# Patient Record
Sex: Female | Born: 1950 | Race: Black or African American | Hispanic: No | Smoking: Current every day smoker
Health system: Southern US, Community
[De-identification: ages and names within clinical notes are randomized; demographics above are authoritative.]

## PROBLEM LIST (undated history)

## (undated) DIAGNOSIS — F0390 Unspecified dementia without behavioral disturbance: Secondary | ICD-10-CM

## (undated) DIAGNOSIS — I10 Essential (primary) hypertension: Secondary | ICD-10-CM

## (undated) DIAGNOSIS — G459 Transient cerebral ischemic attack, unspecified: Secondary | ICD-10-CM

## (undated) HISTORY — PX: ABDOMINAL HYSTERECTOMY: SHX81

---

## 2016-06-30 ENCOUNTER — Other Ambulatory Visit: Payer: Self-pay | Admitting: Family Medicine

## 2016-06-30 DIAGNOSIS — Z1231 Encounter for screening mammogram for malignant neoplasm of breast: Secondary | ICD-10-CM

## 2016-07-06 ENCOUNTER — Ambulatory Visit
Admission: RE | Admit: 2016-07-06 | Discharge: 2016-07-06 | Disposition: A | Discharge status: Home / Self Care | Payer: Medicare Other | Source: Ambulatory Visit | Attending: Family Medicine | Admitting: Family Medicine

## 2016-07-06 ENCOUNTER — Encounter: Payer: Self-pay | Admitting: Radiology

## 2016-07-06 DIAGNOSIS — Z1231 Encounter for screening mammogram for malignant neoplasm of breast: Secondary | ICD-10-CM | POA: Diagnosis not present

## 2017-07-05 ENCOUNTER — Other Ambulatory Visit: Payer: Self-pay | Admitting: Family Medicine

## 2017-07-05 DIAGNOSIS — Z1231 Encounter for screening mammogram for malignant neoplasm of breast: Secondary | ICD-10-CM

## 2017-08-03 ENCOUNTER — Ambulatory Visit
Admission: RE | Admit: 2017-08-03 | Discharge: 2017-08-03 | Disposition: A | Discharge status: Home / Self Care | Payer: Medicare Other | Source: Ambulatory Visit | Attending: Family Medicine | Admitting: Family Medicine

## 2017-08-03 DIAGNOSIS — Z1231 Encounter for screening mammogram for malignant neoplasm of breast: Secondary | ICD-10-CM | POA: Diagnosis not present

## 2017-08-03 DIAGNOSIS — R928 Other abnormal and inconclusive findings on diagnostic imaging of breast: Secondary | ICD-10-CM | POA: Diagnosis not present

## 2017-08-09 ENCOUNTER — Other Ambulatory Visit: Payer: Self-pay | Admitting: Family Medicine

## 2017-08-09 DIAGNOSIS — R928 Other abnormal and inconclusive findings on diagnostic imaging of breast: Secondary | ICD-10-CM

## 2017-08-12 ENCOUNTER — Ambulatory Visit
Admission: RE | Admit: 2017-08-12 | Discharge: 2017-08-12 | Disposition: A | Discharge status: Home / Self Care | Payer: Medicare Other | Source: Ambulatory Visit | Attending: Family Medicine | Admitting: Family Medicine

## 2017-08-12 DIAGNOSIS — R928 Other abnormal and inconclusive findings on diagnostic imaging of breast: Secondary | ICD-10-CM

## 2017-11-28 ENCOUNTER — Emergency Department: Payer: Medicare Other

## 2017-11-28 ENCOUNTER — Encounter: Payer: Self-pay | Admitting: Emergency Medicine

## 2017-11-28 ENCOUNTER — Emergency Department
Admission: EM | Admit: 2017-11-28 | Discharge: 2017-11-28 | Disposition: A | Discharge status: Home / Self Care | Payer: Medicare Other | Attending: Emergency Medicine | Admitting: Emergency Medicine

## 2017-11-28 ENCOUNTER — Other Ambulatory Visit: Payer: Self-pay

## 2017-11-28 DIAGNOSIS — N390 Urinary tract infection, site not specified: Secondary | ICD-10-CM | POA: Insufficient documentation

## 2017-11-28 DIAGNOSIS — R52 Pain, unspecified: Secondary | ICD-10-CM

## 2017-11-28 DIAGNOSIS — F17210 Nicotine dependence, cigarettes, uncomplicated: Secondary | ICD-10-CM | POA: Insufficient documentation

## 2017-11-28 DIAGNOSIS — M79604 Pain in right leg: Secondary | ICD-10-CM | POA: Insufficient documentation

## 2017-11-28 HISTORY — DX: Essential (primary) hypertension: I10

## 2017-11-28 LAB — URINALYSIS, COMPLETE (UACMP) WITH MICROSCOPIC
Bacteria, UA: NONE SEEN
Bilirubin Urine: NEGATIVE
Glucose, UA: NEGATIVE mg/dL
HGB URINE DIPSTICK: NEGATIVE
KETONES UR: NEGATIVE mg/dL
NITRITE: NEGATIVE
PH: 7 (ref 5.0–8.0)
Protein, ur: NEGATIVE mg/dL
Specific Gravity, Urine: 1.011 (ref 1.005–1.030)

## 2017-11-28 MED ORDER — MELOXICAM 15 MG PO TABS: 15.0000 mg | ORAL_TABLET | Freq: Every day | ORAL | 2 refills | Status: DC

## 2017-11-28 MED ORDER — CIPROFLOXACIN HCL 250 MG PO TABS: 250.0000 mg | ORAL_TABLET | Freq: Two times a day (BID) | ORAL | 0 refills | Status: DC

## 2017-11-28 NOTE — ED Triage Notes (Signed)
Pt in via POV with complaints of pain to right knee since Friday, denies any recent injury.  Bruising and old excoriation noted to right knee.  NAD noted at this time.

## 2017-11-28 NOTE — Discharge Instructions (Signed)
Follow-up with your regular doctor if you are not better in 5 days.  Use medication as prescribed.  You had a trace of white cells in your urine so we will treat you for a urinary tract infection.  We are also running a urine culture to make sure we have you on the right antibiotic.  Use the meloxicam for pain as needed.  Apply ice to the knee.  Return if you are worsening

## 2017-11-28 NOTE — ED Provider Notes (Signed)
[facility] Emergency Department Provider Note  ____________________________________________   First MD Initiated Contact with Patient 11/28/17 1231     (approximate)  I have reviewed the triage vital signs and the nursing notes.   HISTORY  Chief Complaint Leg Pain    HPI Lydia Avila is a 67 y.o. female who presents to the emergency department with her daughter.  She can planes of knee pain since Friday.  There is some bruising or questionable burn to the right side of the knee.  The daughter is concerned that she might have a blood clot.  Patient denies chest pain or shortness of breath.  She denies any known injury to the leg.  The daughter is also concerned because she seems to be a little more confused than normal.  Past Medical History:  Diagnosis Date  . Hypertension     There are no active problems to display for this patient.   Past Surgical History:  Procedure Laterality Date  . ABDOMINAL HYSTERECTOMY      Prior to Admission medications   Medication Sig Start Date End Date Taking? Authorizing Provider  ciprofloxacin (CIPRO) 250 MG tablet Take 1 tablet (250 mg total) by mouth 2 (two) times daily. 11/28/17   Zaydon Kinser, Roselyn Bering, PA-C  meloxicam (MOBIC) 15 MG tablet Take 1 tablet (15 mg total) by mouth daily. 11/28/17 11/28/18  Faythe Ghee, PA-C    Allergies Patient has no known allergies.  Family History  Problem Relation Age of Onset  . Breast cancer Neg Hx     Social History Social History   Tobacco Use  . Smoking status: Current Every Day Smoker    Packs/day: 0.25    Types: Cigarettes  . Smokeless tobacco: Never Used  Substance Use Topics  . Alcohol use: No    Frequency: Never  . Drug use: No    Review of Systems  Constitutional: No fever/chills Eyes: No visual changes. ENT: No sore throat. Respiratory: Denies cough Genitourinary: Negative for dysuria. Musculoskeletal: Negative for back pain.  Positive for right knee  pain Skin:  Questionable burn versus bruise    ____________________________________________   PHYSICAL EXAM:  VITAL SIGNS: ED Triage Vitals  Enc Vitals Group     BP 11/28/17 1130 (!) 138/45     Pulse Rate 11/28/17 1130 65     Resp 11/28/17 1130 18     Temp 11/28/17 1130 99.1 F (37.3 C)     Temp Source 11/28/17 1130 Oral     SpO2 11/28/17 1130 95 %     Weight 11/28/17 1131 170 lb (77.1 kg)     Height 11/28/17 1131 5\' 1"  (1.549 m)     Head Circumference --      Peak Flow --      Pain Score 11/28/17 1138 6     Pain Loc --      Pain Edu? --      Excl. in GC? --     Constitutional: Alert and oriented. Well appearing and in no acute distress. Eyes: Conjunctivae are normal.  Head: Atraumatic. Nose: No congestion/rhinnorhea. Mouth/Throat: Mucous membranes are moist.   Cardiovascular: Normal rate, regular rhythm.  Heart sounds are normal Respiratory: Normal respiratory effort.  No retractions, lungs clear to auscultation GU: deferred Musculoskeletal: FROM all extremities, warm and well perfused, right knee is tender.  There is minimal if any swelling.  Neurovascular is intact Neurologic:  Normal speech and language.  Skin:  Skin is warm, dry and intact.  On the right knee there is a red odd patterns rash, questionable burn from the space heater, no blisters are noted.  The area is in a straight line and then in a fishnet-like pattern.  There is no drainage from the area Psychiatric: Mood and affect are normal. Speech and behavior are normal.  ____________________________________________   LABS (all labs ordered are listed, but only abnormal results are displayed)  Labs Reviewed  URINALYSIS, COMPLETE (UACMP) WITH MICROSCOPIC - Abnormal; Notable for the following components:      Result Value   Color, Urine YELLOW (*)    APPearance CLEAR (*)    Leukocytes, UA TRACE (*)    Squamous Epithelial / LPF 0-5 (*)    All other components within normal limits  URINE CULTURE    ____________________________________________   ____________________________________________  RADIOLOGY  X-ray of the right knee is negative for fracture  Ultrasound of the right leg is negative for DVT  ____________________________________________   PROCEDURES  Procedure(s) performed: No  Procedures    ____________________________________________   INITIAL IMPRESSION / ASSESSMENT AND PLAN / ED COURSE  Pertinent labs & imaging results that were available during my care of the patient were reviewed by me and considered in my medical decision making (see chart for details).  Patient is a 67 year old female complaining of right knee pain.  Her daughters IS also concerned about her confusion.  She states she is been more confused lately  On physical exam the right knee is tender and there is an odd rash on the right side of the knee questionable burn from a space heater.  With a small abrasion.  The calf is minimally tender  X-ray of the right knee is negative.  Ultrasound of the right knee is negative for DVT.  Urinalysis is positive for trace of leuks.  Test results were discussed with the patient and her daughter.  Prescription for Cipro 250 mg twice daily for 7 days and meloxicam 15 mg daily were provided for the patient.  The daughter was instructed to monitor the rash.  If it is worsening they should return to the emergency department or see her regular doctor.  Patient is not better in 5-7 days they should follow-up with her regular doctor.  Urine culture was ordered.  Instructed patient that they would call her if the urine culture showed bacteria that was resistant to the antibiotic.  Patient states she understands.  They will comply with the recommendations.  Patient was discharged in stable condition     As part of my medical decision making, I reviewed the following data within the electronic MEDICAL RECORD NUMBER History obtained from family, Nursing notes reviewed and  incorporated, Labs reviewed UA positive trace leuks, Radiograph reviewed x-ray of the knee is negative, ultrasound of the right leg is negative for DVT, Notes from prior ED visits and [place] Controlled Substance Database  ____________________________________________   FINAL CLINICAL IMPRESSION(S) / ED DIAGNOSES  Final diagnoses:  Right leg pain  Urinary tract infection without hematuria, site unspecified      NEW MEDICATIONS STARTED DURING THIS VISIT:  Discharge Medication List as of 11/28/2017  2:11 PM    START taking these medications   Details  ciprofloxacin (CIPRO) 250 MG tablet Take 1 tablet (250 mg total) by mouth 2 (two) times daily., Starting Sun 11/28/2017, Print    meloxicam (MOBIC) 15 MG tablet Take 1 tablet (15 mg total) by mouth daily., Starting Sun 11/28/2017, Until Mon 11/28/2018, Print  Note:  This document was prepared using Dragon voice recognition software and may include unintentional dictation errors.    Faythe Ghee, PA-C 11/28/17 1540    Governor Rooks, MD 12/01/17 [phone]

## 2017-11-30 LAB — URINE CULTURE

## 2018-03-28 ENCOUNTER — Ambulatory Visit: Payer: Medicare Other

## 2018-03-28 ENCOUNTER — Other Ambulatory Visit: Payer: Self-pay | Admitting: *Deleted

## 2018-03-28 ENCOUNTER — Ambulatory Visit (INDEPENDENT_AMBULATORY_CARE_PROVIDER_SITE_OTHER): Payer: Medicare Other

## 2018-03-28 ENCOUNTER — Encounter: Payer: Self-pay | Admitting: Podiatry

## 2018-03-28 ENCOUNTER — Ambulatory Visit (INDEPENDENT_AMBULATORY_CARE_PROVIDER_SITE_OTHER): Payer: Medicare Other | Admitting: Podiatry

## 2018-03-28 DIAGNOSIS — M79674 Pain in right toe(s): Secondary | ICD-10-CM | POA: Diagnosis not present

## 2018-03-28 DIAGNOSIS — M2061 Acquired deformities of toe(s), unspecified, right foot: Secondary | ICD-10-CM

## 2018-03-29 NOTE — Progress Notes (Signed)
Subjective:   Patient ID: Lydia Avila, female   DOB: 67 y.o.   MRN: [id number]   HPI 67 year old female presents the office with concerns of right third toe become more painful and turning inwards more.  She states that she has tried changing shoes without any significant improvement.  She says the left foot is starting the same thing.  She is interested in having surgery to fix the toe.  She has no other concerns today.   Review of Systems  All other systems reviewed and are negative.  Past Medical History:  Diagnosis Date  . Hypertension     Past Surgical History:  Procedure Laterality Date  . ABDOMINAL HYSTERECTOMY       Current Outpatient Medications:  .  ammonium lactate (LAC-HYDRIN) 12 % lotion, Apply topically., Disp: , Rfl:  .  Apremilast (OTEZLA) 30 MG TABS, Take by mouth., Disp: , Rfl:  .  aspirin EC 81 MG tablet, Take by mouth., Disp: , Rfl:  .  atenolol (TENORMIN) 50 MG tablet, Take by mouth., Disp: , Rfl:  .  atenolol (TENORMIN) 50 MG tablet, Take 50 mg by mouth daily., Disp: , Rfl: 3 .  buPROPion (WELLBUTRIN SR) 150 MG 12 hr tablet, TAKE 1 TABLET BY MOUTH TWICE A DAY, Disp: , Rfl:  .  Choline Fenofibrate (FENOFIBRIC ACID) 135 MG CPDR, Take by mouth., Disp: , Rfl:  .  ciprofloxacin (CIPRO) 250 MG tablet, Take 1 tablet (250 mg total) by mouth 2 (two) times daily., Disp: 14 tablet, Rfl: 0 .  diphenhydrAMINE (BENADRYL ALLERGY) 25 MG tablet, Take by mouth., Disp: , Rfl:  .  donepezil (ARICEPT) 10 MG tablet, Take by mouth., Disp: , Rfl:  .  Vitamin D, Ergocalciferol, (DRISDOL) 50000 units CAPS capsule, Take by mouth., Disp: , Rfl:   No Known Allergies       Objective:  Physical Exam  General: AAO x3, NAD-presents today with her daughter  Dermatological: Skin is warm, dry and supple bilateral. Nails x 10 are well manicured; remaining integument appears unremarkable at this time. There are no open sores, no preulcerative lesions, no rash or signs of infection  present.  Vascular: Dorsalis Pedis artery and Posterior Tibial artery pedal pulses are 2/4 bilateral with immedate capillary fill time. Pedal hair growth present. No varicosities and no lower extremity edema present bilateral. There is no pain with calf compression, swelling, warmth, erythema.   Neruologic: Grossly intact via light touch bilateral. Vibratory intact via tuning fork bilateral. Protective threshold with Semmes Wienstein monofilament intact to all pedal sites bilateral.   Musculoskeletal: The right third toe was is a 90 degree angle present the right third toe on the level the PIPJ some deformity is located.  On the left but is reducible and much more mild however on the right foot is semi-reducible.  There is tenderness palpation of the DIPJ.  There is no significant swelling there is no erythema increased warmth.  There is no other areas of tenderness identified at this time.  Muscular strength 5/5 in all groups tested bilateral.  Gait: Unassisted, Nonantalgic.       Assessment:   68 year old female Digital deformity right third toe     Plan:  -Treatment options discussed including all alternatives, risks, and complications -Etiology of symptoms were discussed -X-rays were obtained and reviewed with the patient.  Deformity present of the right third toe at the level the PIPJ.  No evidence of acute fracture identified today. -Discussed with conservative as well as  surgical treatment options.  After discussion the patient was to consider surgery.  We discussed alternatives, risks, complications with surgery.  We discussed the PIPJ arthrodesis with K wire fixation.  She will think about her options. -If she undergoes surgery we will order arterial studies of the TBI to ensure adequate healing.  Vivi BarrackMatthew R Wagoner DPM

## 2018-08-11 ENCOUNTER — Other Ambulatory Visit: Payer: Self-pay | Admitting: Pediatrics

## 2018-08-11 DIAGNOSIS — Z1231 Encounter for screening mammogram for malignant neoplasm of breast: Secondary | ICD-10-CM

## 2018-09-06 ENCOUNTER — Ambulatory Visit
Admission: RE | Admit: 2018-09-06 | Discharge: 2018-09-06 | Disposition: A | Discharge status: Home / Self Care | Payer: Medicare Other | Source: Ambulatory Visit | Attending: Pediatrics | Admitting: Pediatrics

## 2018-09-06 DIAGNOSIS — Z1231 Encounter for screening mammogram for malignant neoplasm of breast: Secondary | ICD-10-CM | POA: Diagnosis not present

## 2018-09-27 ENCOUNTER — Emergency Department: Payer: Medicare Other

## 2018-09-27 ENCOUNTER — Encounter: Payer: Self-pay | Admitting: Emergency Medicine

## 2018-09-27 ENCOUNTER — Inpatient Hospital Stay: Payer: Medicare Other

## 2018-09-27 ENCOUNTER — Inpatient Hospital Stay
Admission: EM | Admit: 2018-09-27 | Discharge: 2018-09-28 | DRG: 066 | Disposition: A | Discharge status: Home Health | Payer: Medicare Other | Attending: Specialist | Admitting: Specialist

## 2018-09-27 ENCOUNTER — Other Ambulatory Visit: Payer: Self-pay

## 2018-09-27 DIAGNOSIS — I639 Cerebral infarction, unspecified: Secondary | ICD-10-CM | POA: Diagnosis present

## 2018-09-27 DIAGNOSIS — R29701 NIHSS score 1: Secondary | ICD-10-CM | POA: Diagnosis not present

## 2018-09-27 DIAGNOSIS — R4781 Slurred speech: Secondary | ICD-10-CM | POA: Diagnosis present

## 2018-09-27 DIAGNOSIS — F329 Major depressive disorder, single episode, unspecified: Secondary | ICD-10-CM | POA: Diagnosis present

## 2018-09-27 DIAGNOSIS — Z90710 Acquired absence of both cervix and uterus: Secondary | ICD-10-CM

## 2018-09-27 DIAGNOSIS — R410 Disorientation, unspecified: Secondary | ICD-10-CM | POA: Diagnosis present

## 2018-09-27 DIAGNOSIS — E785 Hyperlipidemia, unspecified: Secondary | ICD-10-CM | POA: Diagnosis present

## 2018-09-27 DIAGNOSIS — Z79899 Other long term (current) drug therapy: Secondary | ICD-10-CM | POA: Diagnosis not present

## 2018-09-27 DIAGNOSIS — G319 Degenerative disease of nervous system, unspecified: Secondary | ICD-10-CM | POA: Diagnosis present

## 2018-09-27 DIAGNOSIS — F17210 Nicotine dependence, cigarettes, uncomplicated: Secondary | ICD-10-CM | POA: Diagnosis present

## 2018-09-27 DIAGNOSIS — M810 Age-related osteoporosis without current pathological fracture: Secondary | ICD-10-CM | POA: Diagnosis present

## 2018-09-27 DIAGNOSIS — Z8673 Personal history of transient ischemic attack (TIA), and cerebral infarction without residual deficits: Secondary | ICD-10-CM | POA: Diagnosis not present

## 2018-09-27 DIAGNOSIS — I10 Essential (primary) hypertension: Secondary | ICD-10-CM | POA: Diagnosis present

## 2018-09-27 DIAGNOSIS — Z7983 Long term (current) use of bisphosphonates: Secondary | ICD-10-CM | POA: Diagnosis not present

## 2018-09-27 DIAGNOSIS — I340 Nonrheumatic mitral (valve) insufficiency: Secondary | ICD-10-CM | POA: Diagnosis not present

## 2018-09-27 DIAGNOSIS — Z7982 Long term (current) use of aspirin: Secondary | ICD-10-CM

## 2018-09-27 DIAGNOSIS — F0390 Unspecified dementia without behavioral disturbance: Secondary | ICD-10-CM | POA: Diagnosis present

## 2018-09-27 DIAGNOSIS — I361 Nonrheumatic tricuspid (valve) insufficiency: Secondary | ICD-10-CM | POA: Diagnosis not present

## 2018-09-27 DIAGNOSIS — R29700 NIHSS score 0: Secondary | ICD-10-CM | POA: Diagnosis present

## 2018-09-27 DIAGNOSIS — G9389 Other specified disorders of brain: Secondary | ICD-10-CM | POA: Diagnosis present

## 2018-09-27 DIAGNOSIS — F419 Anxiety disorder, unspecified: Secondary | ICD-10-CM | POA: Diagnosis present

## 2018-09-27 LAB — CBC
HCT: 41.4 % (ref 36.0–46.0)
Hemoglobin: 13.1 g/dL (ref 12.0–15.0)
MCH: 27.7 pg (ref 26.0–34.0)
MCHC: 31.6 g/dL (ref 30.0–36.0)
MCV: 87.5 fL (ref 80.0–100.0)
PLATELETS: 270 10*3/uL (ref 150–400)
RBC: 4.73 MIL/uL (ref 3.87–5.11)
RDW: 14.9 % (ref 11.5–15.5)
WBC: 4.8 10*3/uL (ref 4.0–10.5)
nRBC: 0 % (ref 0.0–0.2)

## 2018-09-27 LAB — COMPREHENSIVE METABOLIC PANEL
ALK PHOS: 70 U/L (ref 38–126)
ALT: 16 U/L (ref 0–44)
ANION GAP: 8 (ref 5–15)
AST: 29 U/L (ref 15–41)
Albumin: 4.1 g/dL (ref 3.5–5.0)
BUN: 19 mg/dL (ref 8–23)
CALCIUM: 9.9 mg/dL (ref 8.9–10.3)
CHLORIDE: 106 mmol/L (ref 98–111)
CO2: 24 mmol/L (ref 22–32)
Creatinine, Ser: 1.06 mg/dL — ABNORMAL HIGH (ref 0.44–1.00)
GFR calc non Af Amer: 54 mL/min — ABNORMAL LOW (ref >60)
Glucose, Bld: 93 mg/dL (ref 70–99)
Potassium: 3.6 mmol/L (ref 3.5–5.1)
SODIUM: 138 mmol/L (ref 135–145)
Total Bilirubin: 0.5 mg/dL (ref 0.3–1.2)
Total Protein: 7.3 g/dL (ref 6.5–8.1)

## 2018-09-27 LAB — LIPID PANEL
Cholesterol: 175 mg/dL (ref 0–200)
HDL: 50 mg/dL (ref >40)
LDL Cholesterol: 106 mg/dL — ABNORMAL HIGH (ref 0–99)
Total CHOL/HDL Ratio: 3.5 RATIO
Triglycerides: 96 mg/dL (ref <150)
VLDL: 19 mg/dL (ref 0–40)

## 2018-09-27 LAB — TROPONIN I

## 2018-09-27 LAB — TSH: TSH: 1.468 u[IU]/mL (ref 0.350–4.500)

## 2018-09-27 MED ORDER — ADULT MULTIVITAMIN W/MINERALS CH
1.0000 | ORAL_TABLET | Freq: Every day | ORAL | Status: DC
  Administered 2018-09-27 – 2018-09-28 (×2): 1 via ORAL
  Filled 2018-09-27 (×2): qty 1

## 2018-09-27 MED ORDER — SENNOSIDES-DOCUSATE SODIUM 8.6-50 MG PO TABS: 1.0000 | ORAL_TABLET | Freq: Every evening | ORAL | Status: DC | PRN

## 2018-09-27 MED ORDER — ACETAMINOPHEN 160 MG/5ML PO SOLN
650.0000 mg | ORAL | Status: DC | PRN
  Filled 2018-09-27: qty 20.3

## 2018-09-27 MED ORDER — STROKE: EARLY STAGES OF RECOVERY BOOK
Freq: Once | Status: AC
  Administered 2018-09-27: 16:00:00

## 2018-09-27 MED ORDER — BUPROPION HCL ER (SR) 150 MG PO TB12
150.0000 mg | ORAL_TABLET | Freq: Two times a day (BID) | ORAL | Status: DC
  Administered 2018-09-27 – 2018-09-28 (×2): 150 mg via ORAL
  Filled 2018-09-27 (×4): qty 1

## 2018-09-27 MED ORDER — ASPIRIN EC 325 MG PO TBEC
325.0000 mg | DELAYED_RELEASE_TABLET | Freq: Every day | ORAL | Status: DC
  Administered 2018-09-27 – 2018-09-28 (×2): 325 mg via ORAL
  Filled 2018-09-27 (×2): qty 1

## 2018-09-27 MED ORDER — DONEPEZIL HCL 5 MG PO TABS
10.0000 mg | ORAL_TABLET | Freq: Every day | ORAL | Status: DC
  Filled 2018-09-27 (×2): qty 2

## 2018-09-27 MED ORDER — ACETAMINOPHEN 325 MG PO TABS: 650.0000 mg | ORAL_TABLET | ORAL | Status: DC | PRN

## 2018-09-27 MED ORDER — ENOXAPARIN SODIUM 40 MG/0.4ML ~~LOC~~ SOLN
40.0000 mg | SUBCUTANEOUS | Status: DC
  Administered 2018-09-27 – 2018-09-28 (×2): 40 mg via SUBCUTANEOUS
  Filled 2018-09-27 (×2): qty 0.4

## 2018-09-27 MED ORDER — ACETAMINOPHEN 650 MG RE SUPP: 650.0000 mg | RECTAL | Status: DC | PRN

## 2018-09-27 NOTE — ED Provider Notes (Signed)
[facility] Emergency Department Provider Note   ____________________________________________    I have reviewed the triage vital signs and the nursing notes.   HISTORY  Chief Complaint Confusion    HPI Lydia Avila is a 67 y.o. female brought to the emergency department by her daughter for confusion.  Daughter reports since Sunday (2 days ago) patient has been "acting funny ".  She is appeared somewhat confused and not her normal self.  Has stated that she "does not feel good ".  Patient apparently has a history of a CVA in the past.  And today patient's sister spoke with her and became concerned that she may have had a CVA.  No reports of fevers or chills.  No dysuria.  Questioning patient she states that she knows what she wants to say but is having difficulty saying it and it is frustrating  Past Medical History:  Diagnosis Date  . Hypertension     There are no active problems to display for this patient.   Past Surgical History:  Procedure Laterality Date  . ABDOMINAL HYSTERECTOMY      Prior to Admission medications   Medication Sig Start Date End Date Taking? Authorizing Provider  alendronate (FOSAMAX) 70 MG tablet Take 1 tablet by mouth once a week. 07/18/18 07/18/19 Yes [provider]  aspirin EC 81 MG tablet Take by mouth.   Yes [provider]  atenolol (TENORMIN) 50 MG tablet Take by mouth. 02/23/16  Yes [provider]  buPROPion (WELLBUTRIN SR) 150 MG 12 hr tablet TAKE 1 TABLET BY MOUTH TWICE A DAY 12/14/17 10/26/18 Yes [provider]  Choline Fenofibrate (FENOFIBRIC ACID) 135 MG CPDR Take by mouth.   Yes [provider]  diphenhydrAMINE (BENADRYL ALLERGY) 25 MG tablet Take by mouth.   Yes [provider]  fluocinonide ointment (LIDEX) 0.05 % Apply 1 application topically daily as needed. 07/13/18  Yes [provider]  Multiple Vitamins-Minerals (CENTRUM SILVER ULTRA WOMENS)  TABS Take 1 tablet by mouth daily.   Yes [provider]  nicotine (NICODERM CQ - DOSED IN MG/24 HOURS) 14 mg/24hr patch Place 1 patch onto the skin daily. 08/11/18 10/06/18 Yes [provider]  Vitamin D, Ergocalciferol, (DRISDOL) 50000 units CAPS capsule Take by mouth. 06/23/17  Yes [provider]  Apremilast (OTEZLA) 30 MG TABS Take by mouth. 08/30/17   [provider]  ciprofloxacin (CIPRO) 250 MG tablet Take 1 tablet (250 mg total) by mouth 2 (two) times daily. Patient not taking: Reported on 09/27/2018 11/28/17   Faythe Ghee, PA-C  donepezil (ARICEPT) 10 MG tablet Take by mouth. 02/24/18 02/24/19  [provider]     Allergies Patient has no known allergies.  Family History  Problem Relation Age of Onset  . Breast cancer Neg Hx     Social History Social History   Tobacco Use  . Smoking status: Current Every Day Smoker    Packs/day: 0.25    Types: Cigarettes  . Smokeless tobacco: Never Used  Substance Use Topics  . Alcohol use: No    Frequency: Never  . Drug use: No    Review of Systems  Constitutional: No fever/chills Eyes: No visual changes.  ENT: No reports of difficulty swallowing Cardiovascular: Denies chest pain. Respiratory: Denies shortness of breath. Gastrointestinal: No abdominal pain.   Genitourinary: Negative for dysuria. Musculoskeletal: Negative for back pain. Skin: Negative for rash. Neurological: No headache, difficulty forming words   ____________________________________________  PHYSICAL EXAM:  VITAL SIGNS: ED Triage Vitals  Enc Vitals Group     BP 09/27/18 1043 (!) 154/76     Pulse Rate 09/27/18 1043 64     Resp 09/27/18 1043 18     Temp 09/27/18 1043 98.1 F (36.7 C)     Temp Source 09/27/18 1043 Oral     SpO2 09/27/18 1043 100 %     Weight 09/27/18 1044 76.2 kg (168 lb)     Height 09/27/18 1044 1.575 m (5\' 2" )     Head Circumference --      Peak Flow --      Pain Score 09/27/18  1044 0     Pain Loc --      Pain Edu? --      Excl. in GC? --     Constitutional: Alert and oriented. No acute distress. Eyes: Conjunctivae are normal.  PERRLA, EOMI Nose: No congestion/rhinnorhea.  Cardiovascular: Normal rate, regular rhythm. Grossly normal heart sounds.  Good peripheral circulation. Respiratory: Normal respiratory effort.  No retractions. Lungs CTAB. Gastrointestinal: Soft and nontender. No distention.  No CVA tenderness. Genitourinary: deferred Musculoskeletal: No lower extremity tenderness nor edema.  Warm and well perfused Neurologic: Speech appears normal, no facial droop.. No gross focal neurologic deficits are appreciated.  Normal strength in all extremities, cranial nerves II to XII are normal.  Does appear to have some word finding difficulty Skin:  Skin is warm, dry and intact. No rash noted. Psychiatric: Mood and affect are normal. Speech and behavior are normal.  ____________________________________________   LABS (all labs ordered are listed, but only abnormal results are displayed)  Labs Reviewed  COMPREHENSIVE METABOLIC PANEL - Abnormal; Notable for the following components:      Result Value   Creatinine, Ser 1.06 (*)    GFR calc non Af Amer 54 (*)    All other components within normal limits  URINE CULTURE  CBC  TROPONIN I  TSH  URINALYSIS, COMPLETE (UACMP) WITH MICROSCOPIC   ____________________________________________  EKG  None ____________________________________________  RADIOLOGY  CT head demonstrates acute infarct head of the caudate nucleus ____________________________________________   PROCEDURES  Procedure(s) performed: No  Procedures   Critical Care performed: No ____________________________________________   INITIAL IMPRESSION / ASSESSMENT AND PLAN / ED COURSE  Pertinent labs & imaging results that were available during my care of the patient were reviewed by me and considered in my medical decision making  (see chart for details).  Patient presents with reports of confusion and altered mental status.  Overall her exam is most notable for some word finding difficulty that she appears to be aware of.  Suspicious for CVA, differential also includes metabolic encephalopathy, labs CT pending  CT consistent with acute infarct.  Have discussed with patient and her daughter, will admit the patient to the hospital service    ____________________________________________   FINAL CLINICAL IMPRESSION(S) / ED DIAGNOSES  Final diagnoses:  Cerebrovascular accident (CVA), unspecified mechanism (HCC)        Note:  This document was prepared using Dragon voice recognition software and may include unintentional dictation errors.    Jene EveryKinner, Shawan Corella, MD 09/27/18 [phone]

## 2018-09-27 NOTE — ED Notes (Signed)
Bed had been assigned for , this RN rolling with pt to give bedside report.

## 2018-09-27 NOTE — Progress Notes (Signed)
Family Meeting Note  Advance Directive:no Today a meeting took place with the daughter patient in the ER  Patient came in with expressive aphasia. She has hypertension. She was found to have acute stroke discuss code status with patient she wants to be a full code. Neurology consultation placed   Time spent during discussion 16 mins Enedina FinnerSona Morris Longenecker, MD

## 2018-09-27 NOTE — ED Notes (Signed)
Attempted to call report at this time, on hold for . This RN will try again

## 2018-09-27 NOTE — ED Triage Notes (Signed)
Pt here with daughter. Pt reports she hurts all over.   Per pts daughter, pt has hard a hard time getting her words out and has been confused and stating that she does not feel like herself. Pt last seen at normal baseline was Saturday.

## 2018-09-27 NOTE — ED Notes (Signed)
Pt ambulatory w/ steady gait noted to restroom for urine sample, unable to void at this time .

## 2018-09-27 NOTE — H&P (Signed)
[facility] - [place] at [facility]   PATIENT NAME: Lydia Avila    MR#:  [id number]  DATE OF BIRTH:  16-Mar-1951  DATE OF ADMISSION:  09/27/2018  PRIMARY CARE PHYSICIAN: Orene Desanctis, MD   REQUESTING/REFERRING PHYSICIAN: Dr. Cyril Loosen  CHIEF COMPLAINT:  difficulty word finding not her usual self  HISTORY OF PRESENT ILLNESS:  Lydia Avila  is a 67 y.o. female with a known history of hypertension comes to the emergency room with daughter after she found her not being her usual self since Sunday. Patient was bit confused pleasantly at home was more forgetful and had difficulty finding work since Sunday. She decided to come to the emergency room for evaluation. In the she was found to have acute CVA on CT.  Patient takes aspirin 81 mg daily. She is being admitted for further evaluation of management. No focal weakness dysarthria or blurred vision.  PAST MEDICAL HISTORY:   Past Medical History:  Diagnosis Date  . Hypertension     PAST SURGICAL HISTOIRY:   Past Surgical History:  Procedure Laterality Date  . ABDOMINAL HYSTERECTOMY      SOCIAL HISTORY:   Social History   Tobacco Use  . Smoking status: Current Every Day Smoker    Packs/day: 0.25    Types: Cigarettes  . Smokeless tobacco: Never Used  Substance Use Topics  . Alcohol use: No    Frequency: Never    FAMILY HISTORY:   Family History  Problem Relation Age of Onset  . Breast cancer Neg Hx     DRUG ALLERGIES:  No Known Allergies  REVIEW OF SYSTEMS:  Review of Systems  Constitutional: Negative for chills, fever and weight loss.  HENT: Negative for ear discharge, ear pain and nosebleeds.   Eyes: Negative for blurred vision, pain and discharge.  Respiratory: Negative for sputum production, shortness of breath, wheezing and stridor.   Cardiovascular: Negative for chest pain, palpitations, orthopnea and PND.  Gastrointestinal: Negative for abdominal pain, diarrhea, nausea and  vomiting.  Genitourinary: Negative for frequency and urgency.  Musculoskeletal: Negative for back pain and joint pain.  Neurological: Positive for speech change. Negative for sensory change, focal weakness and weakness.  Psychiatric/Behavioral: Negative for depression and hallucinations. The patient is not nervous/anxious.      MEDICATIONS AT HOME:   Prior to Admission medications   Medication Sig Start Date End Date Taking? Authorizing Provider  alendronate (FOSAMAX) 70 MG tablet Take 1 tablet by mouth once a week. 07/18/18 07/18/19 Yes [provider]  aspirin EC 81 MG tablet Take by mouth.   Yes [provider]  atenolol (TENORMIN) 50 MG tablet Take by mouth. 02/23/16  Yes [provider]  buPROPion (WELLBUTRIN SR) 150 MG 12 hr tablet TAKE 1 TABLET BY MOUTH TWICE A DAY 12/14/17 10/26/18 Yes [provider]  Choline Fenofibrate (FENOFIBRIC ACID) 135 MG CPDR Take by mouth.   Yes [provider]  diphenhydrAMINE (BENADRYL ALLERGY) 25 MG tablet Take by mouth.   Yes [provider]  fluocinonide ointment (LIDEX) 0.05 % Apply 1 application topically daily as needed. 07/13/18  Yes [provider]  Multiple Vitamins-Minerals (CENTRUM SILVER ULTRA WOMENS) TABS Take 1 tablet by mouth daily.   Yes [provider]  nicotine (NICODERM CQ - DOSED IN MG/24 HOURS) 14 mg/24hr patch Place 1 patch onto the skin daily. 08/11/18 10/06/18 Yes [provider]  Vitamin D, Ergocalciferol, (DRISDOL) 50000 units CAPS capsule Take by mouth. 06/23/17  Yes [provider]  Apremilast (OTEZLA) 30 MG TABS Take by mouth. 08/30/17   [provider]  donepezil (ARICEPT) 10 MG tablet Take by mouth. 02/24/18 02/24/19  [provider]      VITAL SIGNS:  Blood pressure (!) 155/71, pulse 65, temperature 98.1 F (36.7 C), temperature source Oral, resp. rate 16, height 5\' 2"  (1.575 m), weight 76.2 kg, SpO2 96 %.  PHYSICAL  EXAMINATION:  GENERAL:  67 y.o.-year-old patient lying in the bed with no acute distress.  EYES: Pupils equal, round, reactive to light and accommodation. No scleral icterus. Extraocular muscles intact.  HEENT: Head atraumatic, normocephalic. Oropharynx and nasopharynx clear.  NECK:  Supple, no jugular venous distention. No thyroid enlargement, no tenderness.  LUNGS: Normal breath sounds bilaterally, no wheezing, rales,rhonchi or crepitation. No use of accessory muscles of respiration.  CARDIOVASCULAR: S1, S2 normal. No murmurs, rubs, or gallops.  ABDOMEN: Soft, nontender, nondistended. Bowel sounds present. No organomegaly or mass.  EXTREMITIES: No pedal edema, cyanosis, or clubbing.  NEUROLOGIC: Cranial nerves II through XII are intact. Muscle strength 5/5 in all extremities. Sensation intact. Gait not checked.  PSYCHIATRIC: The patient is alert and oriented x 3.  SKIN: No obvious rash, lesion, or ulcer.   LABORATORY PANEL:   CBC Recent Labs  Lab 09/27/18 1058  WBC 4.8  HGB 13.1  HCT 41.4  PLT 270   ------------------------------------------------------------------------------------------------------------------  Chemistries  Recent Labs  Lab 09/27/18 1058  NA 138  K 3.6  CL 106  CO2 24  GLUCOSE 93  BUN 19  CREATININE 1.06*  CALCIUM 9.9  AST 29  ALT 16  ALKPHOS 70  BILITOT 0.5   ------------------------------------------------------------------------------------------------------------------  Cardiac Enzymes Recent Labs  Lab 09/27/18 1058  TROPONINI <0.03   ------------------------------------------------------------------------------------------------------------------  RADIOLOGY:  Ct Head Wo Contrast  Result Date: 09/27/2018 CLINICAL DATA:  Dysarthria and confusion EXAM: CT HEAD WITHOUT CONTRAST TECHNIQUE: Contiguous axial images were obtained from the base of the skull through the vertex without intravenous contrast. COMPARISON:  None. FINDINGS: Brain:  Ventricles are moderately prominent with sulci borderline prominent. There is no intracranial mass, hemorrhage, extra-axial fluid collection, or midline shift. There is decreased attenuation involving the head of the caudate nucleus on the left as well as the anterior limb of the left internal capsule and a portion of the anterior left lentiform nucleus which may represent an acute infarct. There is localized cytotoxic edema in this area. Elsewhere, there is patchy small vessel disease in the centra semiovale bilaterally. There is evidence of a prior small infarct in the head of the caudate nucleus on the right. There is evidence of a prior small infarct in the posterior limb left internal capsule. There is an age uncertain but probable subacute to chronic infarct in the posterosuperior most aspect of the right centrum semiovale. Vascular: There is no appreciable hyperdense vessel. There is calcification in each carotid siphon region. Skull: The bony calvarium appears intact. Sinuses/Orbits: Aerated paranasal sinuses are clear. Visualized orbits appear symmetric bilaterally. Other: Mastoid air cells are clear. IMPRESSION: 1. Ventricles are prominent compared to sulci. Question a degree of underlying normal pressure hydrocephalus. 2. Recent appearing and suspected acute infarct involving the head of the caudate nucleus on the left, anterior limb of the left internal capsule, and anterior left lentiform nucleus. 3. Areas of supratentorial small vessel disease elsewhere. Prior basal ganglia lacunar type infarcts bilaterally. Age uncertain infarct superior most aspect of the centrum semiovale on the right posteriorly. Pain 4.  No demonstrable mass or hemorrhage.  5.  There are foci of arterial vascular calcification. Electronically Signed   By: Bretta Bang III M.D.   On: 09/27/2018 11:33   Dg Chest Port 1 View  Result Date: 09/27/2018 CLINICAL DATA:  Confusion EXAM: PORTABLE CHEST 1 VIEW COMPARISON:  None.  FINDINGS: Lungs are clear. Heart is upper normal in size with pulmonary vascularity normal. No adenopathy. There is aortic atherosclerosis. There is postoperative change in the lower cervical spine. IMPRESSION: No edema or consolidation.  Areas of aortic atherosclerosis. Aortic Atherosclerosis (ICD10-I70.0). Electronically Signed   By: Bretta Bang III M.D.   On: 09/27/2018 11:34    EKG:    IMPRESSION AND PLAN:   Archer Vise  is a 67 y.o. female with a known history of hypertension comes to the emergency room with daughter after she found her not being her usual self since Sunday. Patient was bit confused pleasantly at home was more forgetful and had difficulty finding work since Sunday. She decided to come to the emergency room for evaluation. In the she was found to have acute CVA on CT.  1. Acute CVA left internal capsule -admit to medical floor -aspirin 325 mg PO daily -MRI of the brain,MRA of the brain, ultrasound carotid Doppler and echo of the heart -lipid profile -neurology consultation with Dr. Loretha Brasil spoke with him. -Speech therapy, PT OT  2. Hypertension.  -Allow permissive hypertension today.  -Resume atenolol  3. DVT prophylaxis-- subcu Lovenox  Discussed with daughter in the room  All the records are reviewed and case discussed with ED provider. Management plans discussed with the patient, family and they are in agreement.  CODE STATUS: full  TOTAL TIME TAKING CARE OF THIS PATIENT: *50* minutes.    Enedina Finner M.D on 09/27/2018 at 1:59 PM  Between 7am to 6pm - Pager - [phone]  After 6pm go to [email or web address] - password [email or web address]  [facility]  Office  [phone]  CC: Primary care physician; Orene Desanctis, MD

## 2018-09-27 NOTE — ED Notes (Signed)
First Nurse Note: Daughter states she lives with patient who has been confused since Sunday and wants her checked. Patient smiling, placed in WC.

## 2018-09-27 NOTE — ED Notes (Signed)
Bedside report given to [facility], RN

## 2018-09-27 NOTE — ED Notes (Addendum)
This RN attempted to call report again at this time, on hold for 10 min . Immediately called back and was disconnected up on before secretary answer. Called immediately back again and phone was not able to communicate with anyone at this time

## 2018-09-28 ENCOUNTER — Inpatient Hospital Stay: Payer: Medicare Other

## 2018-09-28 ENCOUNTER — Inpatient Hospital Stay (HOSPITAL_COMMUNITY)
Admit: 2018-09-28 | Discharge: 2018-09-28 | Disposition: A | Discharge status: Home / Self Care | Payer: Medicare Other | Attending: Internal Medicine | Admitting: Internal Medicine

## 2018-09-28 DIAGNOSIS — I361 Nonrheumatic tricuspid (valve) insufficiency: Secondary | ICD-10-CM

## 2018-09-28 DIAGNOSIS — I340 Nonrheumatic mitral (valve) insufficiency: Secondary | ICD-10-CM

## 2018-09-28 LAB — URINALYSIS, COMPLETE (UACMP) WITH MICROSCOPIC
BILIRUBIN URINE: NEGATIVE
GLUCOSE, UA: NEGATIVE mg/dL
HGB URINE DIPSTICK: NEGATIVE
KETONES UR: NEGATIVE mg/dL
LEUKOCYTES UA: NEGATIVE
Nitrite: NEGATIVE
PROTEIN: NEGATIVE mg/dL
Specific Gravity, Urine: 1.008 (ref 1.005–1.030)
pH: 6 (ref 5.0–8.0)

## 2018-09-28 LAB — ECHOCARDIOGRAM COMPLETE
HEIGHTINCHES: 62 in
Weight: 2688 oz

## 2018-09-28 MED ORDER — ASPIRIN 325 MG PO TBEC: 325.0000 mg | DELAYED_RELEASE_TABLET | Freq: Every day | ORAL | 1 refills | Status: AC

## 2018-09-28 MED ORDER — ATENOLOL 50 MG PO TABS
50.0000 mg | ORAL_TABLET | Freq: Every day | ORAL | Status: DC
  Administered 2018-09-28: 10:00:00 50 mg via ORAL
  Filled 2018-09-28: qty 1

## 2018-09-28 MED ORDER — PRAVASTATIN SODIUM 40 MG PO TABS: 40.0000 mg | ORAL_TABLET | Freq: Every day | ORAL | 1 refills | Status: AC

## 2018-09-28 NOTE — Progress Notes (Signed)
PT Cancellation Note  Patient Details Name: Lydia Avila MRN: [id number] DOB: 10/01/1951   Cancelled Treatment:    Reason Eval/Treat Not Completed: Other (comment).  Upon attempt to see pt for PT evaluation, pt working with Speech Therapy.  Will attempt to see pt again later today.    Encarnacion ChuAshley Abashian PT, DPT 09/28/2018, 11:56 AM

## 2018-09-28 NOTE — Progress Notes (Signed)
*  PRELIMINARY RESULTS* Echocardiogram 2D Echocardiogram has been performed.  Cristela BlueHege, Gwynneth Fabio 09/28/2018, 9:45 AM

## 2018-09-28 NOTE — Progress Notes (Signed)
Patient discharged home with home health. All discharge instructions given and all questions answered. 

## 2018-09-28 NOTE — Evaluation (Addendum)
Speech Language Pathology Evaluation Patient Details Name: Lydia Avila MRN: [id number] DOB: 1950/12/26 Today's Date: 09/28/2018 Time: 1050-1150 SLP Time Calculation (min) (ACUTE ONLY): 60 min  Problem List:  Patient Active Problem List   Diagnosis Date Noted  . CVA (cerebral vascular accident) (HCC) 09/27/2018   Past Medical History:  Past Medical History:  Diagnosis Date  . Hypertension    Past Surgical History:  Past Surgical History:  Procedure Laterality Date  . ABDOMINAL HYSTERECTOMY     HPI:  Pt is an 67 y.o. female with past medical history of mixed Dementia, CVA, peripheral vascular disease, tobacco abuse, hypertension and hyperlipidemia presenting to the ED on 09/27/2018 with complaints of confusion and word finding difficulties. Per patient's daughter who is currently the primary caregiver, patient has been acting more confused since 09/25/2018. Patient's daughter report that she called her that day at around 12 pm and noticed that she was not acting herself. Patient apparently had lost tract of time, she could not recall that she had ate that day. Patient's daughter also noticed that she more quiet than usual. She reported that patient is usually talkative. No reports of dizziness, headaches, nausea or vomiting, focal motor or sensory deficit, cranial nerve deficit, numbness or tingling sensation in any extremities. On arrival to the ED, she was noted to be pleasantly confused. Initial NIH stroke scale was 0. Initial CT head showed recent appearing acute infarct in the basal ganglia. Follow up MRI brain confirmed small acute infarct of the left basal ganglia, predominantly involving the caudate head. Also noted was Markedly age advanced atrophy and findings of chronic microvascular ischemia. Per Outpatient chart notes w/ PCP/Neurology in 04/2018, pt was not taking otezla or Aricept and according to the Daughter. pt never started these. Daughter reported memory had worsened recently.  Pt saw Dr Laural Benes at [facility] in April of 2019; she was ordered Outpatient ST services for Cognitive-linguistic therapy in Sept-Oct 2019 to see if it would "help w/ her language" per Daughter's report. Services were completed end of Oct 2019.    Assessment / Plan / Recommendation Clinical Impression  Pt seen at bedside w/ presentation of informal screening using the [facility] Cognitive Assessment (MOCA-B) and parts of the Western Aphasia Battery Bedside. Pt has documented Cognitive-linguistic deficits at baseline per chart notes(PCP/Neurology [facility] services) secondary to Markedly age advanced atrophy and findings of chronic microvascular ischemia; Dementia. Per Outpatient chart notes w/ PCP/Neurology in 04/2018, pt was not taking otezla or Aricept and according to the Daughter. pt never started these. Daughter reported memory had worsened recently. Pt was ordered Outpatient ST services for Cognitive-linguistic therapy in Sept-Oct 2019 to see if it would "help w/ her language", per Daughter's report. Services were completed end of Oct 2019. On presentation of the MOCA-B, pt exhibited deficits in tasks of Executive Function, Immediate and Delayed Recall, Attention, and Calculation. During tasks of Visuoperception, Naming, and Orientation, pt was able to improve performance w/ verbal cues and time. With further cues including practice and explanation, pt was able to follow along w/ tasks of Attention and following commands w/ improved accuracy. Pt appeared to respond well to practice w/ a task. During tasks on the Western Bedside, pt exhibited fair accuracy w/ basic and min complex Y/N Questions, Repetition task, and Object Naming of functional daily objects. Pt required time and repetition of following 1-2 step Sequential Commands (this may be impacted by deficits in recall of information as seen on MOCA-B and characteristic of Cognitive impairment). Of note,  pt was unable to identify the need for contacting  the Police or Fire Dept in the "case of an emergency at home", and pt could not identify using "911" as the number to dial in case of an emergency. This was discussed w/ NSG who will discuss it w/ pt's Daughter; possible need for more supervision or use of Life Alert system at home if alone. Recommend pt and Daughter f/u w/ [facility] once discharged for assessment and determination if pt's Cognitive-linguistic status has declined/changed since October when ST services were provided then. [facility] further Cognitive-linguistic needs as determined appropriate to address functional communication skills in ADLs. Pt may benefit from an Adult Day Program to enhance Cognitive-linguistic communication skills. Recommended use of previous strategies as recommended during previous HH ST therapy to aid communication.     SLP Assessment  SLP Recommendation/Assessment: All further Speech Lanaguage Pathology  needs can be addressed in the next venue of care(if determined needs per Neurology f/u assessment) SLP Visit Diagnosis: Cognitive communication deficit (R41.841)    Follow Up Recommendations  (TBD) upon discharge home and f/u w/ PCP/Neurology   Frequency and Duration (TBD)  (TBD)      SLP Evaluation Cognition  Overall Cognitive Status: History of cognitive impairments - at baseline(unsure if more advanced than baseline) Arousal/Alertness: Awake/alert Orientation Level: Oriented to person;Oriented to place;Oriented to situation;Disoriented to time(had to come to the hospital d/t not "feeling well") Attention: Focused Focused Attention: Impaired Focused Attention Impairment: Verbal basic;Functional basic Memory: Impaired(at baseline) Memory Impairment: Decreased recall of new information;Decreased short term memory Problem Solving: Impaired Problem Solving Impairment: Verbal basic;Functional basic(could not ID 911 for emergencies) Executive Function:  Reasoning(impaired) Behaviors: (distracted at times; min confabulation) Safety/Judgment: Impaired Comments: unable to ID use of/dial 911 in emergencies       Comprehension  Auditory Comprehension Overall Auditory Comprehension: Impaired(in presence of Dementia baseline) Yes/No Questions: Impaired(inconsistent w/ complex y/n) Commands: Impaired(able to follow 1 step commands) Conversation: Simple Other Conversation Comments: impact from Cognitive abilities on Linguistic skills; intermittent paraphasias - unsure if returning close to/at her baseline  Interfering Components: Attention(baseline Cogntive-linguistic deficits per Dtr/chart) EffectiveTechniques: Repetition(Given time) Visual Recognition/Discrimination Discrimination: Not tested Reading Comprehension Reading Status: Not tested    Expression Expression Primary Mode of Expression: Verbal Verbal Expression Overall Verbal Expression: Impaired Initiation: No impairment Automatic Speech: Name;Social Response;Counting;Day of week(WFL) Level of Generative/Spontaneous Verbalization: Phrase;Word Repetition: No impairment Naming: No impairment Pragmatics: (WFL) Interfering Components: Premorbid deficit(Cognitive-linguistic deficits per chart notes) Effective Techniques: Semantic cues Non-Verbal Means of Communication: Not applicable Written Expression Dominant Hand: Right Written Expression: Not tested   Oral / Motor  Oral Motor/Sensory Function Overall Oral Motor/Sensory Function: Within functional limits Motor Speech Overall Motor Speech: Appears within functional limits for tasks assessed Respiration: Within functional limits Phonation: Normal Resonance: Within functional limits Articulation: Within functional limitis Intelligibility: Intelligible Motor Planning: Witnin functional limits Motor Speech Errors: Not applicable Interfering Components: (n/a)   GO                      Jerilynn SomKatherine Macio Kissoon, MS,  CCC-SLP Codee Bloodworth 09/28/2018, 3:05 PM

## 2018-09-28 NOTE — Progress Notes (Signed)
OT Cancellation Note  Patient Details Name: Lydia Avila MRN: [id number] DOB: [birth year]   Cancelled Treatment:    Reason Eval/Treat Not Completed: Other (comment). Order received, chart reviewed. Pt physically being assisted out of room discharged upon attempt.   Richrd PrimeJamie Stiller, MPH, MS, OTR/L ascom [phone] 09/28/18, 3:20 PM

## 2018-09-28 NOTE — Discharge Summary (Signed)
[facility] [place] [facility]   PATIENT NAME: Lydia Avila    MR#:  [id number]  DATE OF BIRTH:  1951/08/31  DATE OF ADMISSION:  09/27/2018 ADMITTING PHYSICIAN: Enedina Finner, MD  DATE OF DISCHARGE: 09/28/2018  3:22 PM  PRIMARY CARE PHYSICIAN: Orene Desanctis, MD    ADMISSION DIAGNOSIS:  Cerebrovascular accident (CVA), unspecified mechanism (HCC) [I63.9]  DISCHARGE DIAGNOSIS:  Active Problems:   CVA (cerebral vascular accident) (HCC)   SECONDARY DIAGNOSIS:   Past Medical History:  Diagnosis Date  . Hypertension     HOSPITAL COURSE:   67 year old female with past medical history of hypertension, osteoporosis, anxiety/depression, dementia who presented to the hospital due to slurred speech.  1.  Acute CVA- this was the cause of patient's slurred speech and admission.  Patient's MRI of the brain showed a small acute infarct of the left basal ganglia.  Patient's MRA of the head showed no acute intracranial stenosis, patient's carotid duplex showed no hemodynamically significant carotid artery stenosis. - Patient was seen by neurology who recommended changing patient's baby aspirin to a full dose aspirin at 325 mg.  Patient was also started on a high-dose intensity statin. -Patient was seen by physical therapy who recommended home health which is being arranged for the patient prior to discharge. -Patient was also strongly advised to abstain from smoking.  2.  Essential hypertension- patient will continue the atenolol.  3.  Osteoporosis-patient will continue her Fosamax.  4.  Anxiety/depression-patient will continue her Wellbutrin.  SHe is being discharged home with home health services.   DISCHARGE CONDITIONS:   Stable  CONSULTS OBTAINED:  Treatment Team:  Kym Groom, MD  DRUG ALLERGIES:  No Known Allergies  DISCHARGE MEDICATIONS:   Allergies as of 09/28/2018   No Known Allergies     Medication List    TAKE these medications    alendronate 70 MG tablet Commonly known as:  FOSAMAX Take 1 tablet by mouth once a week.   aspirin 325 MG EC tablet Take 1 tablet (325 mg total) by mouth daily. Start taking on:  September 29, 2018 What changed:    medication strength  how much to take  when to take this   atenolol 50 MG tablet Commonly known as:  TENORMIN Take by mouth.   BENADRYL ALLERGY 25 MG tablet Generic drug:  diphenhydrAMINE Take by mouth.   buPROPion 150 MG 12 hr tablet Commonly known as:  WELLBUTRIN SR TAKE 1 TABLET BY MOUTH TWICE A DAY   calcium carbonate 1250 (500 Ca) MG tablet Commonly known as:  OS-CAL - dosed in mg of elemental calcium Take 1 tablet by mouth daily.   CENTRUM SILVER ULTRA WOMENS Tabs Take 1 tablet by mouth daily.   donepezil 10 MG tablet Commonly known as:  ARICEPT Take by mouth.   Fenofibric Acid 135 MG Cpdr Take by mouth.   fluocinonide ointment 0.05 % Commonly known as:  LIDEX Apply 1 application topically daily as needed.   nicotine 14 mg/24hr patch Commonly known as:  NICODERM CQ - dosed in mg/24 hours Place 1 patch onto the skin daily.   OTEZLA 30 MG Tabs Generic drug:  Apremilast Take by mouth.   pravastatin 40 MG tablet Commonly known as:  PRAVACHOL Take 1 tablet (40 mg total) by mouth daily.   Vitamin D (Ergocalciferol) 1.25 MG (50000 UT) Caps capsule Commonly known as:  DRISDOL Take by mouth.         DISCHARGE INSTRUCTIONS:   DIET:  Cardiac diet  DISCHARGE CONDITION:  Stable  ACTIVITY:  Activity as tolerated  OXYGEN:  Home Oxygen: No.   Oxygen Delivery: room air  DISCHARGE LOCATION:  Home with Home Health PT   If you experience worsening of your admission symptoms, develop shortness of breath, life threatening emergency, suicidal or homicidal thoughts you must seek medical attention immediately by calling 911 or calling your MD immediately  if symptoms less severe.  You Must read complete instructions/literature along with  all the possible adverse reactions/side effects for all the Medicines you take and that have been prescribed to you. Take any new Medicines after you have completely understood and accpet all the possible adverse reactions/side effects.   Please note  You were cared for by a hospitalist during your hospital stay. If you have any questions about your discharge medications or the care you received while you were in the hospital after you are discharged, you can call the unit and asked to speak with the hospitalist on call if the hospitalist that took care of you is not available. Once you are discharged, your primary care physician will handle any further medical issues. Please note that NO REFILLS for any discharge medications will be authorized once you are discharged, as it is imperative that you return to your primary care physician (or establish a relationship with a primary care physician if you do not have one) for your aftercare needs so that they can reassess your need for medications and monitor your lab values.     Today   Slurred speech has resolved.  Patient remains somewhat confused still.  No focal motor or sensory deficits.  Seen by physical therapy who recommended home health services which is being arranged.  Seen by neurology who her agree with this management.   VITAL SIGNS:  Blood pressure 119/89, pulse 62, temperature 98.7 F (37.1 C), temperature source Oral, resp. rate 16, height 5\' 2"  (1.575 m), weight 76.2 kg, SpO2 99 %.  I/O:    Intake/Output Summary (Last 24 hours) at 09/28/2018 1626 Last data filed at 09/27/2018 1926 Gross per 24 hour  Intake 200 ml  Output -  Net 200 ml    PHYSICAL EXAMINATION:  GENERAL:  67 y.o.-year-old patient lying in the bed with no acute distress.  EYES: Pupils equal, round, reactive to light and accommodation. No scleral icterus. Extraocular muscles intact.  HEENT: Head atraumatic, normocephalic. Oropharynx and nasopharynx clear.   NECK:  Supple, no jugular venous distention. No thyroid enlargement, no tenderness.  LUNGS: Normal breath sounds bilaterally, no wheezing, rales,rhonchi. No use of accessory muscles of respiration.  CARDIOVASCULAR: S1, S2 normal. No murmurs, rubs, or gallops.  ABDOMEN: Soft, non-tender, non-distended. Bowel sounds present. No organomegaly or mass.  EXTREMITIES: No pedal edema, cyanosis, or clubbing.  NEUROLOGIC: Cranial nerves II through XII are intact. No focal motor or sensory defecits b/l.  PSYCHIATRIC: The patient is alert and oriented x 2. SKIN: No obvious rash, lesion, or ulcer.   DATA REVIEW:   CBC Recent Labs  Lab 09/27/18 1058  WBC 4.8  HGB 13.1  HCT 41.4  PLT 270    Chemistries  Recent Labs  Lab 09/27/18 1058  NA 138  K 3.6  CL 106  CO2 24  GLUCOSE 93  BUN 19  CREATININE 1.06*  CALCIUM 9.9  AST 29  ALT 16  ALKPHOS 70  BILITOT 0.5    Cardiac Enzymes Recent Labs  Lab 09/27/18 1058  TROPONINI <0.03    Microbiology  Results  Results for orders placed or performed during the hospital encounter of 11/28/17  Urine Culture     Status: Abnormal   Collection Time: 11/28/17 12:44 PM  Result Value Ref Range Status   Specimen Description   Final    URINE, RANDOM Performed at [facility], [address], [place], [place] [address]    Special Requests   Final    NONE Performed at [facility], [address] [place], [place] [address]    Culture MULTIPLE SPECIES PRESENT, SUGGEST RECOLLECTION (A)  Final   Report Status 11/30/2017 FINAL  Final    RADIOLOGY:  Ct Head Wo Contrast  Result Date: 09/27/2018 CLINICAL DATA:  Dysarthria and confusion EXAM: CT HEAD WITHOUT CONTRAST TECHNIQUE: Contiguous axial images were obtained from the base of the skull through the vertex without intravenous contrast. COMPARISON:  None. FINDINGS: Brain: Ventricles are moderately prominent with sulci borderline prominent. There is no intracranial mass,  hemorrhage, extra-axial fluid collection, or midline shift. There is decreased attenuation involving the head of the caudate nucleus on the left as well as the anterior limb of the left internal capsule and a portion of the anterior left lentiform nucleus which may represent an acute infarct. There is localized cytotoxic edema in this area. Elsewhere, there is patchy small vessel disease in the centra semiovale bilaterally. There is evidence of a prior small infarct in the head of the caudate nucleus on the right. There is evidence of a prior small infarct in the posterior limb left internal capsule. There is an age uncertain but probable subacute to chronic infarct in the posterosuperior most aspect of the right centrum semiovale. Vascular: There is no appreciable hyperdense vessel. There is calcification in each carotid siphon region. Skull: The bony calvarium appears intact. Sinuses/Orbits: Aerated paranasal sinuses are clear. Visualized orbits appear symmetric bilaterally. Other: Mastoid air cells are clear. IMPRESSION: 1. Ventricles are prominent compared to sulci. Question a degree of underlying normal pressure hydrocephalus. 2. Recent appearing and suspected acute infarct involving the head of the caudate nucleus on the left, anterior limb of the left internal capsule, and anterior left lentiform nucleus. 3. Areas of supratentorial small vessel disease elsewhere. Prior basal ganglia lacunar type infarcts bilaterally. Age uncertain infarct superior most aspect of the centrum semiovale on the right posteriorly. Pain 4.  No demonstrable mass or hemorrhage. 5.  There are foci of arterial vascular calcification. Electronically Signed   By: Bretta Bang III M.D.   On: 09/27/2018 11:33   Mr Brain Wo Contrast  Result Date: 09/28/2018 CLINICAL DATA:  Ataxia EXAM: MRI HEAD WITHOUT CONTRAST MRA HEAD WITHOUT CONTRAST TECHNIQUE: Multiplanar, multiecho pulse sequences of the brain and surrounding structures were  obtained without intravenous contrast. Angiographic images of the head were obtained using MRA technique without contrast. COMPARISON:  Head CT 09/27/2018 FINDINGS: MRI HEAD FINDINGS BRAIN: Abnormal diffusion restriction within the left corpus striatum. No acute hemorrhage. No midline shift or other mass effect. Mild edema at the infarct site. Early confluent hyperintense T2-weighted signal of the periventricular and deep white matter, most commonly due to chronic ischemic microangiopathy. Markedly advanced atrophy for age. Multiple old deep gray nuclei infarcts. Susceptibility-sensitive sequences show no chronic microhemorrhage or superficial siderosis. SKULL AND UPPER CERVICAL SPINE: The visualized skull base, calvarium, upper cervical spine and extracranial soft tissues are normal. SINUSES/ORBITS: No fluid levels or advanced mucosal thickening. No mastoid or middle ear effusion. The orbits are normal. MRA HEAD FINDINGS Intracranial internal carotid arteries: Normal.  Anterior cerebral arteries: Normal. Middle cerebral arteries: Mild multifocal narrowing of the bilateral M2 segments, greatest at the right inferior division. Posterior communicating arteries: Present bilaterally. Posterior cerebral arteries: Normal. Basilar artery: Normal. Vertebral arteries: Left dominant. Normal. Superior cerebellar arteries: Normal. Inferior cerebellar arteries: Normal. IMPRESSION: 1. Small acute infarct of the left basal ganglia, predominantly involving the caudate head. 2. No hemorrhage or mass effect. 3. No intracranial arterial occlusion or high-grade stenosis. 4. Markedly age advanced atrophy and findings of chronic microvascular ischemia. Electronically Signed   By: Deatra RobinsonKevin  Herman M.D.   On: 09/28/2018 05:28   [place] Carotid Bilateral (at [facility] Only)  Result Date: 09/27/2018 CLINICAL DATA:  Stroke. EXAM: BILATERAL CAROTID DUPLEX ULTRASOUND TECHNIQUE: Wallace CullensGray scale imaging, color Doppler and duplex ultrasound were  performed of bilateral carotid and vertebral arteries in the neck. COMPARISON:  None. FINDINGS: Criteria: Quantification of carotid stenosis is based on velocity parameters that correlate the residual internal carotid diameter with NASCET-based stenosis levels, using the diameter of the distal internal carotid lumen as the denominator for stenosis measurement. The following velocity measurements were obtained: RIGHT ICA: 41/12 cm/sec CCA: 34/9 cm/sec SYSTOLIC ICA/CCA RATIO:  1.2 ECA: 69 cm/sec LEFT ICA: 55/17 cm/sec CCA: 51/12 cm/sec SYSTOLIC ICA/CCA RATIO:  1.1 ECA: 61 cm/sec RIGHT CAROTID ARTERY: Partially calcified nonocclusive plaque in the common carotid artery and bulb, extending to the ICA origin. Normal waveforms and color Doppler signal. No high-grade stenosis. RIGHT VERTEBRAL ARTERY:  Normal flow direction and waveform. LEFT CAROTID ARTERY: Mild tortuosity. Eccentric calcified plaque in the bulb. Normal waveforms and color Doppler signal. No high-grade stenosis. LEFT VERTEBRAL ARTERY:  Normal flow direction and waveform. IMPRESSION: 1. Bilateral carotid bifurcation plaque resulting in less than 50% diameter stenosis. 2.  Antegrade bilateral vertebral arterial flow. Electronically Signed   By: Corlis Leak  Hassell M.D.   On: 09/27/2018 17:04   Dg Chest Port 1 View  Result Date: 09/27/2018 CLINICAL DATA:  Confusion EXAM: PORTABLE CHEST 1 VIEW COMPARISON:  None. FINDINGS: Lungs are clear. Heart is upper normal in size with pulmonary vascularity normal. No adenopathy. There is aortic atherosclerosis. There is postoperative change in the lower cervical spine. IMPRESSION: No edema or consolidation.  Areas of aortic atherosclerosis. Aortic Atherosclerosis (ICD10-I70.0). Electronically Signed   By: Bretta BangWilliam  Woodruff III M.D.   On: 09/27/2018 11:34   Mr Maxine GlennMra Head/brain UJWo Cm  Result Date: 09/28/2018 CLINICAL DATA:  Ataxia EXAM: MRI HEAD WITHOUT CONTRAST MRA HEAD WITHOUT CONTRAST TECHNIQUE: Multiplanar, multiecho  pulse sequences of the brain and surrounding structures were obtained without intravenous contrast. Angiographic images of the head were obtained using MRA technique without contrast. COMPARISON:  Head CT 09/27/2018 FINDINGS: MRI HEAD FINDINGS BRAIN: Abnormal diffusion restriction within the left corpus striatum. No acute hemorrhage. No midline shift or other mass effect. Mild edema at the infarct site. Early confluent hyperintense T2-weighted signal of the periventricular and deep white matter, most commonly due to chronic ischemic microangiopathy. Markedly advanced atrophy for age. Multiple old deep gray nuclei infarcts. Susceptibility-sensitive sequences show no chronic microhemorrhage or superficial siderosis. SKULL AND UPPER CERVICAL SPINE: The visualized skull base, calvarium, upper cervical spine and extracranial soft tissues are normal. SINUSES/ORBITS: No fluid levels or advanced mucosal thickening. No mastoid or middle ear effusion. The orbits are normal. MRA HEAD FINDINGS Intracranial internal carotid arteries: Normal. Anterior cerebral arteries: Normal. Middle cerebral arteries: Mild multifocal narrowing of the bilateral M2 segments, greatest at the right inferior division. Posterior communicating arteries: Present bilaterally. Posterior cerebral arteries:  Normal. Basilar artery: Normal. Vertebral arteries: Left dominant. Normal. Superior cerebellar arteries: Normal. Inferior cerebellar arteries: Normal. IMPRESSION: 1. Small acute infarct of the left basal ganglia, predominantly involving the caudate head. 2. No hemorrhage or mass effect. 3. No intracranial arterial occlusion or high-grade stenosis. 4. Markedly age advanced atrophy and findings of chronic microvascular ischemia. Electronically Signed   By: Deatra Robinson M.D.   On: 09/28/2018 05:28      Management plans discussed with the patient, family and they are in agreement.  CODE STATUS:     Code Status Orders  (From admission, onward)          Start     Ordered   09/27/18 1427  Full code  Continuous     09/27/18 1426       TOTAL TIME TAKING CARE OF THIS PATIENT: 40 minutes.    Houston Siren M.D on 09/28/2018 at 4:26 PM  Between 7am to 6pm - Pager - [phone]  After 6pm go to [email or web address] - Social research officer, government  [facility] [place]  Office  [phone]  CC: Primary care physician; Orene Desanctis, MD

## 2018-09-28 NOTE — Consult Note (Addendum)
Referring Physician: Hilda Lias    Chief Complaint: Confusion and word finding difficulties  HPI: Lydia Avila is an 67 y.o. female with past medical history of mixed dementia, CVA, peripheral vascular disease, tobacco abuse, hypertension and hyperlipidemia presenting to the ED on 09/27/2018 with complaints of confusion and word finding difficulties. Per patient's daughter who is currently the primary caregiver, patient has been acting confused since 09/25/2018. Patient's daughter report that she called her that day at around 12 pm and noticed that she was not acting her self. Patient apparently had lost tract of time, she could not recall that she had ate that day and kept repeating herself. Patient's daughter also noticed that she more quite than usual. She report that patient is usually talkative but now was having difficulty expressing herself and following simple directions. No reports of dizziness, headaches, nausea or vomiting, focal motor or sensory deficit, cranial nerve deficit, numbness or tingling sensation in any extremities. On arrival to the ED she was noted to be pleasantly confused. Initial NIH stroke scale was 0. Initial CT head showed recent appearing acute infarct in the basal ganglia. Follow up MRI brain confirmed small acute infarct of the left basal ganglia, predominantly involving the caudate head. Also noted was ventriculomegaly likely ex-vacuo dilation due to cerebral atrophy. Patient was admitted for further stroke work up and management.  Date last known well: Date: 09/25/2018 Time last known well: Time: 12:00 tPA Given: No: outside window period  Past Medical History:  Diagnosis Date  . Hypertension     Past Surgical History:  Procedure Laterality Date  . ABDOMINAL HYSTERECTOMY      Family History  Problem Relation Age of Onset  . Breast cancer Neg Hx    Social History:  reports that she has been smoking cigarettes. She has been smoking about 0.25 packs per  day. She has never used smokeless tobacco. She reports that she does not drink alcohol or use drugs.  Allergies: No Known Allergies  Medications:  I have reviewed the patient's current medications. Prior to Admission:  Medications Prior to Admission  Medication Sig Dispense Refill Last Dose  . alendronate (FOSAMAX) 70 MG tablet Take 1 tablet by mouth once a week.   09/25/2018 at 1000  . aspirin EC 81 MG tablet Take by mouth.   09/26/2018 at 1000  . atenolol (TENORMIN) 50 MG tablet Take by mouth.   09/26/2018 at 1000  . buPROPion (WELLBUTRIN SR) 150 MG 12 hr tablet TAKE 1 TABLET BY MOUTH TWICE A DAY   09/26/2018 at 1600  . Choline Fenofibrate (FENOFIBRIC ACID) 135 MG CPDR Take by mouth.   09/26/2018 at 1000  . diphenhydrAMINE (BENADRYL ALLERGY) 25 MG tablet Take by mouth.   prn at prn  . fluocinonide ointment (LIDEX) 0.05 % Apply 1 application topically daily as needed.   Past Week at prn  . Multiple Vitamins-Minerals (CENTRUM SILVER ULTRA WOMENS) TABS Take 1 tablet by mouth daily.   09/26/2018 at 1000  . nicotine (NICODERM CQ - DOSED IN MG/24 HOURS) 14 mg/24hr patch Place 1 patch onto the skin daily.   09/26/2018 at Unknown time  . Vitamin D, Ergocalciferol, (DRISDOL) 50000 units CAPS capsule Take by mouth.   09/25/2018 at 1000  . Apremilast (OTEZLA) 30 MG TABS Take by mouth.   Not Taking at Unknown time  . donepezil (ARICEPT) 10 MG tablet Take by mouth.   Not Taking at Unknown time   Scheduled: . aspirin EC  325 mg Oral Daily  .  atenolol  50 mg Oral Daily  . buPROPion  150 mg Oral BID  . donepezil  10 mg Oral QHS  . enoxaparin (LOVENOX) injection  40 mg Subcutaneous Q24H  . multivitamin with minerals  1 tablet Oral Daily    ROS: History obtained from the patient   General ROS: negative for - chills, fatigue, fever, night sweats, weight gain or weight loss Psychological ROS: negative for - behavioral disorder, hallucinations, memory difficulties, mood swings or suicidal  ideation Ophthalmic ROS: negative for - blurry vision, double vision, eye pain or loss of vision ENT ROS: negative for - epistaxis, nasal discharge, oral lesions, sore throat, tinnitus or vertigo Allergy and Immunology ROS: negative for - hives or itchy/watery eyes Hematological and Lymphatic ROS: negative for - bleeding problems, bruising or swollen lymph nodes Endocrine ROS: negative for - galactorrhea, hair pattern changes, polydipsia/polyuria or temperature intolerance Respiratory ROS: negative for - cough, hemoptysis, shortness of breath or wheezing Cardiovascular ROS: negative for - chest pain, dyspnea on exertion, edema or irregular heartbeat Gastrointestinal ROS: negative for - abdominal pain, diarrhea, hematemesis, nausea/vomiting or stool incontinence Genito-Urinary ROS: negative for - dysuria, hematuria, incontinence or urinary frequency/urgency Musculoskeletal ROS: negative for - joint swelling or muscular weakness Neurological ROS: as noted in HPI Dermatological ROS: negative for rash and skin lesion changes  Physical Examination: Blood pressure 139/74, pulse 73, temperature 98.5 F (36.9 C), resp. rate 18, height 5\' 2"  (1.575 m), weight 76.2 kg, SpO2 99 %.   HEENT-  Normocephalic, no lesions, without obvious abnormality.  Normal external eye and conjunctiva.  Normal TM's bilaterally.  Normal auditory canals and external ears. Normal external nose, mucus membranes and septum.  Normal pharynx. Cardiovascular- S1, S2 normal, pulses palpable throughout   Lungs- chest clear, no wheezing, rales, normal symmetric air entry Abdomen- soft, non-tender; bowel sounds normal; no masses,  no organomegaly Extremities- no edema Lymph-no adenopathy palpable Musculoskeletal-no joint tenderness, deformity or swelling Skin-warm and dry, no hyperpigmentation, vitiligo, or suspicious lesions  Neurological Exam   Mental Status: Alert, oriented to person and place, thought content appropriate.   Speech fluent without evidence of aphasia.  Difficulty following 3 step commands. When asked to touch her right ear with her left thumb, she was unable to perform this. Difficulty with word recall and retention (0/3). She could not recall the 3 objects given after 2 minutes of repeating them. Attention span and concentration seemed appropriate  Cranial Nerves: II: Discs flat bilaterally; Visual fields grossly normal, pupils equal, round, reactive to light and accommodation III,IV, VI: ptosis not present, extra-ocular motions intact bilaterally V,VII: smile symmetric, facial light touch sensation intact VIII: hearing normal bilaterally IX,X: gag reflex present XI: bilateral shoulder shrug XII: midline tongue extension Motor: Right :  Upper extremity   5/5 Without pronator drift      Left: Upper extremity   5/5 without pronator drift Right:   Lower extremity   5/5                                          Left: Lower extremity   5/5 Tone and bulk:normal tone throughout; no atrophy noted Sensory: Pinprick and light touch intact bilaterally Deep Tendon Reflexes: 2+ and symmetric throughout Plantars: Right: downgoing  Left: downgoing Cerebellar: Finger-to-nose testing intact bilaterally. Heel to shin testing normal bilaterally Gait: not tested due to safety concerns  Data Reviewed  Laboratory Studies:  Basic Metabolic Panel: Recent Labs  Lab 09/27/18 1058  NA 138  K 3.6  CL 106  CO2 24  GLUCOSE 93  BUN 19  CREATININE 1.06*  CALCIUM 9.9    Liver Function Tests: Recent Labs  Lab 09/27/18 1058  AST 29  ALT 16  ALKPHOS 70  BILITOT 0.5  PROT 7.3  ALBUMIN 4.1   No results for input(s): LIPASE, AMYLASE in the last 168 hours. No results for input(s): AMMONIA in the last 168 hours.  CBC: Recent Labs  Lab 09/27/18 1058  WBC 4.8  HGB 13.1  HCT 41.4  MCV 87.5  PLT 270    Cardiac Enzymes: Recent Labs  Lab 09/27/18 1058  TROPONINI <0.03     BNP: Invalid input(s): POCBNP  CBG: No results for input(s): GLUCAP in the last 168 hours.  Microbiology: Results for orders placed or performed during the hospital encounter of 11/28/17  Urine Culture     Status: Abnormal   Collection Time: 11/28/17 12:44 PM  Result Value Ref Range Status   Specimen Description   Final    URINE, RANDOM Performed at [facility], [address] [place], [place] [address]    Special Requests   Final    NONE Performed at [facility], [address] [place], [place] [address]    Culture MULTIPLE SPECIES PRESENT, SUGGEST RECOLLECTION (A)  Final   Report Status 11/30/2017 FINAL  Final    Coagulation Studies: No results for input(s): LABPROT, INR in the last 72 hours.  Urinalysis:  Recent Labs  Lab 09/27/18 2355  COLORURINE YELLOW*  LABSPEC 1.008  PHURINE 6.0  GLUCOSEU NEGATIVE  HGBUR NEGATIVE  BILIRUBINUR NEGATIVE  KETONESUR NEGATIVE  PROTEINUR NEGATIVE  NITRITE NEGATIVE  LEUKOCYTESUR NEGATIVE    Lipid Panel:    Component Value Date/Time   CHOL 175 09/27/2018 1058   TRIG 96 09/27/2018 1058   HDL 50 09/27/2018 1058   CHOLHDL 3.5 09/27/2018 1058   VLDL 19 09/27/2018 1058   LDLCALC 106 (H) 09/27/2018 1058    HgbA1C: No results found for: HGBA1C  Urine Drug Screen:  No results found for: LABOPIA, COCAINSCRNUR, LABBENZ, AMPHETMU, THCU, LABBARB  Alcohol Level: No results for input(s): ETH in the last 168 hours.  Other results: EKG: normal EKG, normal sinus rhythm, unchanged from previous tracings.  Imaging: Ct Head Wo Contrast  Result Date: 09/27/2018 CLINICAL DATA:  Dysarthria and confusion EXAM: CT HEAD WITHOUT CONTRAST TECHNIQUE: Contiguous axial images were obtained from the base of the skull through the vertex without intravenous contrast. COMPARISON:  None. FINDINGS: Brain: Ventricles are moderately prominent with sulci borderline prominent. There is no intracranial mass, hemorrhage, extra-axial  fluid collection, or midline shift. There is decreased attenuation involving the head of the caudate nucleus on the left as well as the anterior limb of the left internal capsule and a portion of the anterior left lentiform nucleus which may represent an acute infarct. There is localized cytotoxic edema in this area. Elsewhere, there is patchy small vessel disease in the centra semiovale bilaterally. There is evidence of a prior small infarct in the head of the caudate nucleus on the right. There is evidence of a prior small infarct in the posterior limb left internal capsule. There is an age uncertain but probable subacute to chronic infarct in the posterosuperior most  aspect of the right centrum semiovale. Vascular: There is no appreciable hyperdense vessel. There is calcification in each carotid siphon region. Skull: The bony calvarium appears intact. Sinuses/Orbits: Aerated paranasal sinuses are clear. Visualized orbits appear symmetric bilaterally. Other: Mastoid air cells are clear. IMPRESSION: 1. Ventricles are prominent compared to sulci. Question a degree of underlying normal pressure hydrocephalus. 2. Recent appearing and suspected acute infarct involving the head of the caudate nucleus on the left, anterior limb of the left internal capsule, and anterior left lentiform nucleus. 3. Areas of supratentorial small vessel disease elsewhere. Prior basal ganglia lacunar type infarcts bilaterally. Age uncertain infarct superior most aspect of the centrum semiovale on the right posteriorly. Pain 4.  No demonstrable mass or hemorrhage. 5.  There are foci of arterial vascular calcification. Electronically Signed   By: Bretta Bang III M.D.   On: 09/27/2018 11:33   Mr Brain Wo Contrast  Result Date: 09/28/2018 CLINICAL DATA:  Ataxia EXAM: MRI HEAD WITHOUT CONTRAST MRA HEAD WITHOUT CONTRAST TECHNIQUE: Multiplanar, multiecho pulse sequences of the brain and surrounding structures were obtained without  intravenous contrast. Angiographic images of the head were obtained using MRA technique without contrast. COMPARISON:  Head CT 09/27/2018 FINDINGS: MRI HEAD FINDINGS BRAIN: Abnormal diffusion restriction within the left corpus striatum. No acute hemorrhage. No midline shift or other mass effect. Mild edema at the infarct site. Early confluent hyperintense T2-weighted signal of the periventricular and deep white matter, most commonly due to chronic ischemic microangiopathy. Markedly advanced atrophy for age. Multiple old deep gray nuclei infarcts. Susceptibility-sensitive sequences show no chronic microhemorrhage or superficial siderosis. SKULL AND UPPER CERVICAL SPINE: The visualized skull base, calvarium, upper cervical spine and extracranial soft tissues are normal. SINUSES/ORBITS: No fluid levels or advanced mucosal thickening. No mastoid or middle ear effusion. The orbits are normal. MRA HEAD FINDINGS Intracranial internal carotid arteries: Normal. Anterior cerebral arteries: Normal. Middle cerebral arteries: Mild multifocal narrowing of the bilateral M2 segments, greatest at the right inferior division. Posterior communicating arteries: Present bilaterally. Posterior cerebral arteries: Normal. Basilar artery: Normal. Vertebral arteries: Left dominant. Normal. Superior cerebellar arteries: Normal. Inferior cerebellar arteries: Normal. IMPRESSION: 1. Small acute infarct of the left basal ganglia, predominantly involving the caudate head. 2. No hemorrhage or mass effect. 3. No intracranial arterial occlusion or high-grade stenosis. 4. Markedly age advanced atrophy and findings of chronic microvascular ischemia. Electronically Signed   By: Deatra Robinson M.D.   On: 09/28/2018 05:28   US Carotid Bilateral (at [facility] And [facility] Only)  Result Date: 09/27/2018 CLINICAL DATA:  Stroke. EXAM: BILATERAL CAROTID DUPLEX ULTRASOUND TECHNIQUE: Wallace Cullens scale imaging, color Doppler and duplex ultrasound were performed of bilateral  carotid and vertebral arteries in the neck. COMPARISON:  None. FINDINGS: Criteria: Quantification of carotid stenosis is based on velocity parameters that correlate the residual internal carotid diameter with NASCET-based stenosis levels, using the diameter of the distal internal carotid lumen as the denominator for stenosis measurement. The following velocity measurements were obtained: RIGHT ICA: 41/12 cm/sec CCA: 34/9 cm/sec SYSTOLIC ICA/CCA RATIO:  1.2 ECA: 69 cm/sec LEFT ICA: 55/17 cm/sec CCA: 51/12 cm/sec SYSTOLIC ICA/CCA RATIO:  1.1 ECA: 61 cm/sec RIGHT CAROTID ARTERY: Partially calcified nonocclusive plaque in the common carotid artery and bulb, extending to the ICA origin. Normal waveforms and color Doppler signal. No high-grade stenosis. RIGHT VERTEBRAL ARTERY:  Normal flow direction and waveform. LEFT CAROTID ARTERY: Mild tortuosity. Eccentric calcified plaque in the bulb. Normal waveforms and color Doppler signal. No high-grade stenosis. LEFT VERTEBRAL ARTERY:  Normal flow direction and waveform. IMPRESSION: 1. Bilateral carotid bifurcation plaque resulting in less than 50% diameter stenosis. 2.  Antegrade bilateral vertebral arterial flow. Electronically Signed   By: Corlis Leak M.D.   On: 09/27/2018 17:04   Dg Chest Port 1 View  Result Date: 09/27/2018 CLINICAL DATA:  Confusion EXAM: PORTABLE CHEST 1 VIEW COMPARISON:  None. FINDINGS: Lungs are clear. Heart is upper normal in size with pulmonary vascularity normal. No adenopathy. There is aortic atherosclerosis. There is postoperative change in the lower cervical spine. IMPRESSION: No edema or consolidation.  Areas of aortic atherosclerosis. Aortic Atherosclerosis (ICD10-I70.0). Electronically Signed   By: Bretta Bang III M.D.   On: 09/27/2018 11:34   Mr Lydia Avila Head/brain ZO Cm  Result Date: 09/28/2018 CLINICAL DATA:  Ataxia EXAM: MRI HEAD WITHOUT CONTRAST MRA HEAD WITHOUT CONTRAST TECHNIQUE: Multiplanar, multiecho pulse sequences of the  brain and surrounding structures were obtained without intravenous contrast. Angiographic images of the head were obtained using MRA technique without contrast. COMPARISON:  Head CT 09/27/2018 FINDINGS: MRI HEAD FINDINGS BRAIN: Abnormal diffusion restriction within the left corpus striatum. No acute hemorrhage. No midline shift or other mass effect. Mild edema at the infarct site. Early confluent hyperintense T2-weighted signal of the periventricular and deep white matter, most commonly due to chronic ischemic microangiopathy. Markedly advanced atrophy for age. Multiple old deep gray nuclei infarcts. Susceptibility-sensitive sequences show no chronic microhemorrhage or superficial siderosis. SKULL AND UPPER CERVICAL SPINE: The visualized skull base, calvarium, upper cervical spine and extracranial soft tissues are normal. SINUSES/ORBITS: No fluid levels or advanced mucosal thickening. No mastoid or middle ear effusion. The orbits are normal. MRA HEAD FINDINGS Intracranial internal carotid arteries: Normal. Anterior cerebral arteries: Normal. Middle cerebral arteries: Mild multifocal narrowing of the bilateral M2 segments, greatest at the right inferior division. Posterior communicating arteries: Present bilaterally. Posterior cerebral arteries: Normal. Basilar artery: Normal. Vertebral arteries: Left dominant. Normal. Superior cerebellar arteries: Normal. Inferior cerebellar arteries: Normal. IMPRESSION: 1. Small acute infarct of the left basal ganglia, predominantly involving the caudate head. 2. No hemorrhage or mass effect. 3. No intracranial arterial occlusion or high-grade stenosis. 4. Markedly age advanced atrophy and findings of chronic microvascular ischemia. Electronically Signed   By: Deatra Robinson M.D.   On: 09/28/2018 05:28    Assessment: 67 y.o. female past medical history of mixed dementia, CVA, peripheral vascular disease, tobacco abuse, hypertension and hyperlipidemia presenting to the ED on  09/27/2018 with complaints of confusion and word finding difficulties.Initial CT head showed recent appearing acute infarct in the basal ganglia. MRI brain reviewed and show small acute infarct of the left basal ganglia, predominantly involving the caudate head. Also noted was ventriculomegaly likely ex-vacuo dilation due to cerebral atrophy. Etiology likely small vessel disease. MRA head negative for LVO. US carotids bilaterally did not show hemodynamically significant stenosis. LDL 106. Patient state she was taking Aspirin 81 mg prior to this event.  Stroke Risk Factors - family history, hyperlipidemia, hypertension and smoking  Plan: 1. HgbA1c 2. Echocardiogram pending 3. Increase Aspirin to 325 mg/day with intensive management of vascular risk factor to keep systolic BP (SBP) <140 mm Hg (109 mm Hg if diabetic)  4. Aggressive lipid management with goal low density lipoprotein (LDL) <70 mg/dl. 5. Smoking cessation counselling 6. Telemetry monitoring 7. Frequent neuro checks  This patient was staffed with Dr. Loretha Brasil, Doyle Askew who personally evaluated patient, reviewed documentation and agreed with assessment and plan of care as above.  Webb Silversmith, DNP, FNP-BC Board certified  Nurse Practitioner Neurology Department  09/28/2018, 9:40 AM

## 2018-09-28 NOTE — Evaluation (Signed)
Physical Therapy Evaluation Patient Details Name: Lydia Avila MRN: [id number] DOB: 1951/06/07 Today's Date: 09/28/2018   History of Present Illness  Pt is a 67 y/o F who presented with confusion, word finding difficulties, and forgetfulness.  Pt found to have acute CVA on CT.  MRI brain showed acute infarct of the L basal ganglia.      Clinical Impression  Pt admitted with above diagnosis. Pt currently with functional limitations due to the deficits listed below (see PT Problem List). Lydia Avila was very pleasant but does demonstrate expressive difficulties and difficulties naming objects.  She follows one step commands well but has more difficulty with multi-step commands.  Pt looks to her daughter to answer some of her background information.  Pt demonstrates mild instability and guarded posture while ambulating without AD which improves with introduction of RW, pt instructed to use RW moving forward as a temporary assistance.  Pt's BLE strength, coordination, and sensation appear WNL with testing.  Recommending 24/7 supervision at d/c due to concerns for safety in the home due to cognitive deficits.  Pt will benefit from skilled PT to increase their independence and safety with mobility to allow discharge to the venue listed below.      Follow Up Recommendations Home health PT;Supervision/Assistance - 24 hour    Equipment Recommendations  None recommended by PT    Recommendations for Other Services OT consult     Precautions / Restrictions Precautions Precautions: Fall Restrictions Weight Bearing Restrictions: No      Mobility  Bed Mobility Overal bed mobility: Needs Assistance Bed Mobility: Supine to Sit     Supine to sit: Supervision;HOB elevated     General bed mobility comments: Supervision for safety.  No physical assist required.   Transfers Overall transfer level: Needs assistance Equipment used: None Transfers: Sit to/from Stand Sit to Stand: Supervision          General transfer comment: No physical assist or cues needed.  Supervision for safety.    Ambulation/Gait Ambulation/Gait assistance: Min guard Gait Distance (Feet): 160 Feet Assistive device: None;Rolling walker (2 wheeled) Gait Pattern/deviations: Decreased step length - right;Decreased step length - left Gait velocity: decreased   General Gait Details: Guarded posture but steady without AD.  Min guard for safety.  With introduction of RW pt's gait speed slightly increases and pt's guarded posture eliminated.   Stairs            Wheelchair Mobility    Modified Rankin (Stroke Patients Only)       Balance Overall balance assessment: Needs assistance;History of Falls Sitting-balance support: No upper extremity supported;Feet supported Sitting balance-Leahy Scale: Good     Standing balance support: No upper extremity supported;During functional activity Standing balance-Leahy Scale: Fair Standing balance comment: Pt able to stand and ambulate without UE support but would likely lose her balance with perturbation.                              Pertinent Vitals/Pain      Home Living Family/patient expects to be discharged to:: Private residence Living Arrangements: Children(daughter) Available Help at Discharge: Family;Available 24 hours/day(daughter on Christmas break from work at [employer]) Type of Home: House Home Access: Level entry     Home Layout: One level Home Equipment: Environmental consultant - 2 wheels;Shower seat;Grab bars - tub/shower Additional Comments: Daughter off work for a few weeks for the holidays and is hoping to have pt enrolled in "  adult day care" once by the time she returns to work so pt will have continued 24/7 supervision.     Prior Function Level of Independence: Needs assistance   Gait / Transfers Assistance Needed: Ambulating without AD. 1 fall in the past 3 months.   ADL's / Homemaking Assistance Needed:  Ind with bathing, dressing.   Daughter prepares the food, pt is able to warm up meals. Pt not driving.         Hand Dominance        Extremity/Trunk Assessment   Upper Extremity Assessment Upper Extremity Assessment: Defer to OT evaluation LUE Coordination: decreased gross motor(slightly impaired with finger to nose)    Lower Extremity Assessment Lower Extremity Assessment: Overall WFL for tasks assessed(WNL sensation, coordination, strength)       Communication   Communication: Expressive difficulties  Cognition Arousal/Alertness: Awake/alert Behavior During Therapy: WFL for tasks assessed/performed Overall Cognitive Status: Difficult to assess                                        General Comments General comments (skin integrity, edema, etc.): Daughter present during evaluation.      Exercises Other Exercises Other Exercises: Pt with expressive aphasia with word finding difficulties.  Had pt practice naming objects in room and their function, encouraged pt to continue practice doing this with family/friends.    Assessment/Plan    PT Assessment Patient needs continued PT services  PT Problem List Decreased balance;Decreased cognition;Decreased safety awareness;Decreased knowledge of use of DME       PT Treatment Interventions DME instruction;Gait training;Therapeutic exercise;Balance training;Neuromuscular re-education;Patient/family education;Cognitive remediation    PT Goals (Current goals can be found in the Care Plan section)  Acute Rehab PT Goals Patient Stated Goal: to return home PT Goal Formulation: With patient Time For Goal Achievement: 10/12/18 Potential to Achieve Goals: Good    Frequency 7X/week   Barriers to discharge        Co-evaluation               AM-PAC PT "6 Clicks" Mobility  Outcome Measure Help needed turning from your back to your side while in a flat bed without using bedrails?: None Help needed moving from lying on your back to  sitting on the side of a flat bed without using bedrails?: A Little Help needed moving to and from a bed to a chair (including a wheelchair)?: A Little Help needed standing up from a chair using your arms (e.g., wheelchair or bedside chair)?: A Little Help needed to walk in hospital room?: A Little Help needed climbing 3-5 steps with a railing? : A Little 6 Click Score: 19    End of Session Equipment Utilized During Treatment: Gait belt Activity Tolerance: Patient tolerated treatment well Patient left: in chair;with call bell/phone within reach;with chair alarm set;with family/visitor present Nurse Communication: Mobility status PT Visit Diagnosis: Unsteadiness on feet (R26.81)    Time: [id number] PT Time Calculation (min) (ACUTE ONLY): 35 min   Charges:   PT Evaluation $PT Eval Moderate Complexity: 1 Mod PT Treatments $Gait Training: 8-22 mins        Session was performed by student PT, Lisbeth RenshawShane Courtney, and directed, overseen, and documented by this PT.  Encarnacion ChuAshley Ainara Eldridge PT, DPT 09/28/2018, 2:13 PM

## 2018-09-28 NOTE — Care Management Note (Signed)
Case Management Note  Patient Details  Name: Lydia Avila MRN: [id number] Date of Birth: [birth year]  Subjective/Objective:   Admitted to [facility] with a CVA. Lives with daughter Lydia Avila. [phone]). Seen Dr. Judithann Avila last September. Prescriptions are filled at [facility] [address].  Takes care of all basic activities of daily living herself. Daughter helps with errands. Home Health per [facility] in the past. No skilled facility. No home oxygen. Rolling walker and cane in the home.  Last fall was Thanksgiving Day. Good appetite. Daughter will transport.                  Action/Plan: Physical therapy is recommending home with physical therapy. Home Health Compare information for zip code [phone] query, completed. A copy for the chart and one for the family Discussed agencies with daughter at the bedside. Chose [facility]. Lydia Avila, [facility] representative updated.   Expected Discharge Date:                  Expected Discharge Plan:     In-House Referral:   yes  Discharge planning Services   yes  Post Acute Care Choice:   yes Choice offered to:    daughter  DME Arranged:    DME Agency:     HH Arranged:   yes HH Agency:   [facility]  Status of Service:     If discussed at Long Length of Stay Meetings, dates discussed:    Additional Comments:  Lydia GreetBrenda S Zeph Riebel, RN MSN CCM Care Management [phone] 09/28/2018, 2:03 PM

## 2018-09-29 LAB — URINE CULTURE

## 2018-10-04 ENCOUNTER — Telehealth: Payer: Self-pay | Admitting: Licensed Clinical Social Worker

## 2018-10-04 NOTE — Telephone Encounter (Signed)
EMMI flagged patient for answering yes to loss of interest in things. Clinical Child psychotherapistocial Worker (CSW) contacted patient via telephone. Patient scored 0 on PHQ-9. Per patient she is doing well and had home health PT today. Patient reported that she has a doctor's appointment next Wednesday with her PCP. Patient reported that she is not sad or depressed. Per patient she is not having thoughts about hurting herself. Patient reported no needs or concerns.   Baker Hughes IncorporatedBailey Carisa Backhaus, LCSW [phone]

## 2019-08-25 ENCOUNTER — Other Ambulatory Visit: Payer: Self-pay | Admitting: Pediatrics

## 2019-08-25 DIAGNOSIS — Z1231 Encounter for screening mammogram for malignant neoplasm of breast: Secondary | ICD-10-CM

## 2019-09-12 ENCOUNTER — Ambulatory Visit
Admission: RE | Admit: 2019-09-12 | Discharge: 2019-09-12 | Disposition: A | Discharge status: Home / Self Care | Payer: Medicare Other | Source: Ambulatory Visit | Attending: Pediatrics | Admitting: Pediatrics

## 2019-09-12 DIAGNOSIS — Z1231 Encounter for screening mammogram for malignant neoplasm of breast: Secondary | ICD-10-CM | POA: Diagnosis not present

## 2019-12-12 ENCOUNTER — Other Ambulatory Visit: Payer: Self-pay | Admitting: Pediatrics

## 2019-12-12 ENCOUNTER — Other Ambulatory Visit (HOSPITAL_COMMUNITY): Payer: Self-pay | Admitting: Pediatrics

## 2019-12-12 ENCOUNTER — Ambulatory Visit
Admission: RE | Admit: 2019-12-12 | Discharge: 2019-12-12 | Disposition: A | Discharge status: Home / Self Care | Payer: Medicare Other | Source: Ambulatory Visit | Attending: Pediatrics | Admitting: Pediatrics

## 2019-12-12 ENCOUNTER — Other Ambulatory Visit: Payer: Self-pay

## 2019-12-12 DIAGNOSIS — M7989 Other specified soft tissue disorders: Secondary | ICD-10-CM | POA: Diagnosis present

## 2019-12-31 ENCOUNTER — Emergency Department
Admission: EM | Admit: 2019-12-31 | Discharge: 2019-12-31 | Disposition: A | Discharge status: Home / Self Care | Payer: Medicare Other | Attending: Emergency Medicine | Admitting: Emergency Medicine

## 2019-12-31 ENCOUNTER — Emergency Department: Payer: Medicare Other

## 2019-12-31 ENCOUNTER — Encounter: Payer: Self-pay | Admitting: Emergency Medicine

## 2019-12-31 ENCOUNTER — Other Ambulatory Visit: Payer: Self-pay

## 2019-12-31 DIAGNOSIS — R4701 Aphasia: Secondary | ICD-10-CM | POA: Diagnosis not present

## 2019-12-31 DIAGNOSIS — Z8673 Personal history of transient ischemic attack (TIA), and cerebral infarction without residual deficits: Secondary | ICD-10-CM | POA: Diagnosis not present

## 2019-12-31 DIAGNOSIS — I10 Essential (primary) hypertension: Secondary | ICD-10-CM | POA: Diagnosis not present

## 2019-12-31 DIAGNOSIS — F0390 Unspecified dementia without behavioral disturbance: Secondary | ICD-10-CM | POA: Insufficient documentation

## 2019-12-31 DIAGNOSIS — Z79899 Other long term (current) drug therapy: Secondary | ICD-10-CM | POA: Diagnosis not present

## 2019-12-31 DIAGNOSIS — F17210 Nicotine dependence, cigarettes, uncomplicated: Secondary | ICD-10-CM | POA: Insufficient documentation

## 2019-12-31 DIAGNOSIS — R479 Unspecified speech disturbances: Secondary | ICD-10-CM | POA: Diagnosis present

## 2019-12-31 HISTORY — DX: Unspecified dementia, unspecified severity, without behavioral disturbance, psychotic disturbance, mood disturbance, and anxiety: F03.90

## 2019-12-31 HISTORY — DX: Transient cerebral ischemic attack, unspecified: G45.9

## 2019-12-31 LAB — CBC
HCT: 42.1 % (ref 36.0–46.0)
Hemoglobin: 13 g/dL (ref 12.0–15.0)
MCH: 27.7 pg (ref 26.0–34.0)
MCHC: 30.9 g/dL (ref 30.0–36.0)
MCV: 89.6 fL (ref 80.0–100.0)
Platelets: 289 10*3/uL (ref 150–400)
RBC: 4.7 MIL/uL (ref 3.87–5.11)
RDW: 14.7 % (ref 11.5–15.5)
WBC: 5.6 10*3/uL (ref 4.0–10.5)
nRBC: 0 % (ref 0.0–0.2)

## 2019-12-31 LAB — DIFFERENTIAL
Abs Immature Granulocytes: 0.01 10*3/uL (ref 0.00–0.07)
Basophils Absolute: 0.1 10*3/uL (ref 0.0–0.1)
Basophils Relative: 1 %
Eosinophils Absolute: 0.4 10*3/uL (ref 0.0–0.5)
Eosinophils Relative: 6 %
Immature Granulocytes: 0 %
Lymphocytes Relative: 42 %
Lymphs Abs: 2.4 10*3/uL (ref 0.7–4.0)
Monocytes Absolute: 0.4 10*3/uL (ref 0.1–1.0)
Monocytes Relative: 6 %
Neutro Abs: 2.5 10*3/uL (ref 1.7–7.7)
Neutrophils Relative %: 45 %

## 2019-12-31 LAB — COMPREHENSIVE METABOLIC PANEL
ALT: 13 U/L (ref 0–44)
AST: 19 U/L (ref 15–41)
Albumin: 3.9 g/dL (ref 3.5–5.0)
Alkaline Phosphatase: 84 U/L (ref 38–126)
Anion gap: 10 (ref 5–15)
BUN: 23 mg/dL (ref 8–23)
CO2: 25 mmol/L (ref 22–32)
Calcium: 9.5 mg/dL (ref 8.9–10.3)
Chloride: 103 mmol/L (ref 98–111)
Creatinine, Ser: 0.99 mg/dL (ref 0.44–1.00)
GFR calc Af Amer: >60 mL/min (ref >60)
GFR calc non Af Amer: 58 mL/min — ABNORMAL LOW (ref >60)
Glucose, Bld: 112 mg/dL — ABNORMAL HIGH (ref 70–99)
Potassium: 3.5 mmol/L (ref 3.5–5.1)
Sodium: 138 mmol/L (ref 135–145)
Total Bilirubin: 0.6 mg/dL (ref 0.3–1.2)
Total Protein: 7.8 g/dL (ref 6.5–8.1)

## 2019-12-31 LAB — GLUCOSE, CAPILLARY: Glucose-Capillary: 94 mg/dL (ref 70–99)

## 2019-12-31 LAB — APTT: aPTT: 29 seconds (ref 24–36)

## 2019-12-31 LAB — PROTIME-INR
INR: 0.9 (ref 0.8–1.2)
Prothrombin Time: 12.2 seconds (ref 11.4–15.2)

## 2019-12-31 MED ORDER — IOHEXOL 350 MG/ML SOLN
75.0000 mL | Freq: Once | INTRAVENOUS | Status: AC | PRN
  Administered 2019-12-31: 100 mL via INTRAVENOUS

## 2019-12-31 MED ORDER — SODIUM CHLORIDE 0.9% FLUSH
3.0000 mL | Freq: Once | INTRAVENOUS | Status: DC
Start: 2019-12-31 — End: 2019-12-31

## 2019-12-31 NOTE — ED Provider Notes (Signed)
[facility] EMERGENCY DEPARTMENT Provider Note   CSN: [id number] Arrival date & time: 12/31/19  1225     History Chief Complaint  Patient presents with  . Aphasia    Lydia Avila is a 69 y.o. female history dementia, hypertension, previous stroke here presenting with trouble speaking.  She states that she had trouble speaking starting 3 days ago.  Daughter thought it was going to go away but has not resolved but instead was getting worse.  Today, patient was on the floor and felt very dizzy.  Daughter noticed that she seemed to have balance issues over the last several days to.  Patient does have a history of previous stroke as well as dementia.  The history is provided by a relative and the patient.  Level V caveat- dementia      Past Medical History:  Diagnosis Date  . Dementia (HCC)   . Hypertension   . TIA (transient ischemic attack)     Patient Active Problem List   Diagnosis Date Noted  . CVA (cerebral vascular accident) (HCC) 09/27/2018    Past Surgical History:  Procedure Laterality Date  . ABDOMINAL HYSTERECTOMY       OB History   No obstetric history on file.     Family History  Problem Relation Age of Onset  . Breast cancer Neg Hx     Social History   Tobacco Use  . Smoking status: Current Every Day Smoker    Packs/day: 0.25    Types: Cigarettes  . Smokeless tobacco: Never Used  Substance Use Topics  . Alcohol use: No  . Drug use: No    Home Medications Prior to Admission medications   Medication Sig Start Date End Date Taking? Authorizing Provider  Apremilast (OTEZLA) 30 MG TABS Take by mouth. 08/30/17   [provider]  atenolol (TENORMIN) 50 MG tablet Take by mouth. 02/23/16   [provider]  buPROPion (WELLBUTRIN SR) 150 MG 12 hr tablet TAKE 1 TABLET BY MOUTH TWICE A DAY 12/14/17 10/26/18  [provider]  calcium carbonate (OS-CAL - DOSED IN MG OF ELEMENTAL CALCIUM) 1250 (500 Ca) MG tablet  Take 1 tablet by mouth daily.    [provider]  Choline Fenofibrate (FENOFIBRIC ACID) 135 MG CPDR Take by mouth.    [provider]  diphenhydrAMINE (BENADRYL ALLERGY) 25 MG tablet Take by mouth.    [provider]  donepezil (ARICEPT) 10 MG tablet Take by mouth. 02/24/18 02/24/19  [provider]  fluocinonide ointment (LIDEX) 0.05 % Apply 1 application topically daily as needed. 07/13/18   [provider]  Multiple Vitamins-Minerals (CENTRUM SILVER ULTRA WOMENS) TABS Take 1 tablet by mouth daily.    [provider]  pravastatin (PRAVACHOL) 40 MG tablet Take 1 tablet (40 mg total) by mouth daily. 09/28/18 11/27/18  Houston Siren, MD  Vitamin D, Ergocalciferol, (DRISDOL) 50000 units CAPS capsule Take by mouth. 06/23/17   [provider]    Allergies    Patient has no known allergies.  Review of Systems   Review of Systems  Neurological: Positive for dizziness and speech difficulty.  All other systems reviewed and are negative.   Physical Exam Updated Vital Signs BP (!) 154/115 (BP Location: Left Arm)   Pulse 66   Temp 98.4 F (36.9 C) (Oral)   Resp 16   Ht 5\' 2"  (1.575 m)   Wt 82.1 kg   SpO2 100%   BMI 33.11 kg/m  Physical Exam Vitals and nursing note reviewed.  Constitutional:      Appearance: Normal appearance.  HENT:     Head: Normocephalic.     Nose: Nose normal.     Mouth/Throat:     Mouth: Mucous membranes are moist.  Eyes:     Extraocular Movements: Extraocular movements intact.     Pupils: Pupils are equal, round, and reactive to light.  Cardiovascular:     Rate and Rhythm: Normal rate and regular rhythm.     Pulses: Normal pulses.     Heart sounds: Normal heart sounds.  Pulmonary:     Effort: Pulmonary effort is normal.     Breath sounds: Normal breath sounds.  Abdominal:     General: Abdomen is flat.     Palpations: Abdomen is soft.  Musculoskeletal:        General: Normal range of motion.      Cervical back: Normal range of motion.  Skin:    General: Skin is warm.     Capillary Refill: Capillary refill takes less than 2 seconds.  Neurological:     General: No focal deficit present.     Mental Status: She is alert.     Comments: Some expressive aphasia, 4/5 strength L arm and leg, 5/5 right side. Some dysmetria on left side   Psychiatric:        Mood and Affect: Mood normal.        Behavior: Behavior normal.     ED Results / Procedures / Treatments   Labs (all labs ordered are listed, but only abnormal results are displayed) Labs Reviewed  COMPREHENSIVE METABOLIC PANEL - Abnormal; Notable for the following components:      Result Value   Glucose, Bld 112 (*)    GFR calc non Af Amer 58 (*)    All other components within normal limits  PROTIME-INR  APTT  CBC  DIFFERENTIAL  GLUCOSE, CAPILLARY  CBG MONITORING, ED    EKG None  Radiology CT HEAD WO CONTRAST  Result Date: 12/31/2019 CLINICAL DATA:  69 year old female with difficulty with speech. Possible stroke. EXAM: CT HEAD WITHOUT CONTRAST TECHNIQUE: Contiguous axial images were obtained from the base of the skull through the vertex without intravenous contrast. COMPARISON:  Head CT 09/27/2018. FINDINGS: Brain: Moderate cerebral atrophy with ex vacuo dilatation of the ventricular system, similar to the prior examination. Patchy and confluent areas of decreased attenuation are noted throughout the deep and periventricular white matter of the cerebral hemispheres bilaterally, compatible with chronic microvascular ischemic disease. Several more well-defined areas of low attenuation are noted in the basal ganglia bilaterally, most apparent in the head of the caudate nuclei, most compatible with old lacunar infarcts. No evidence of acute infarction, hemorrhage, hydrocephalus, extra-axial collection or mass lesion/mass effect. Vascular: No hyperdense vessel or unexpected calcification. Skull: Normal. Negative for fracture or  focal lesion. Sinuses/Orbits: No acute finding. Other: None. IMPRESSION: 1. No acute intracranial abnormalities. 2. Moderate cerebral atrophy with extensive chronic microvascular ischemic changes in the cerebral white matter and old bilateral basal ganglia lacunar infarcts, as detailed above. Electronically Signed   By: Vinnie Langton M.D.   On: 12/31/2019 13:16    Procedures Procedures (including critical care time)  Angiocath insertion Performed by: Wandra Arthurs  Consent: Verbal consent obtained. Risks and benefits: risks, benefits and alternatives were discussed Time out: Immediately prior to procedure a "time out" was called to verify the correct patient, procedure, equipment, support staff and site/side marked as required.  Preparation: Patient was prepped and draped in the usual sterile fashion.  Vein Location: R antecube  Ultrasound Guided  Gauge: 20 long   Normal blood return and flush without difficulty Patient tolerance: Patient tolerated the procedure well with no immediate complications.     Medications Ordered in ED Medications  sodium chloride flush (NS) 0.9 % injection 3 mL (has no administration in time range)    ED Course  I have reviewed the triage vital signs and the nursing notes.  Pertinent labs & imaging results that were available during my care of the patient were reviewed by me and considered in my medical decision making (see chart for details).    MDM Rules/Calculators/A&P                      Belladonna Lubinski is a 69 y.o. female who presented with trouble speaking and trouble with balance.  Concern for another stroke especially for posterior circulation stroke but also consider worsening dementia.  Patient's symptoms or for about 3 days so she is not a LVO candidate.  Will get CTA head and neck and MRI brain.  Will get lab work as well.  5:48 PM Labs unremarkable and minimal right brain showed no stroke.  CTA showed no dissection or LVO.  She has  seen Dr. Sherryll Burger from neurology already.  Likely worsening dementia.  Patient is not orthostatic and ambulated by herself.  Stable for discharge.  Final Clinical Impression(s) / ED Diagnoses Final diagnoses:  None    Rx / DC Orders ED Discharge Orders    None       Charlynne Pander, MD 12/31/19 [phone]

## 2019-12-31 NOTE — ED Triage Notes (Signed)
Pt to ED via POV with daughter, who she lives with. Pt dtr states that yesterday she noticed that pt was off balance and that she had some slurred speech. She reports thinking that her mother was tired but states that the symptoms have not gotten any better. Pt has had TIAs in the past. Pt also has hx/o dementia. Pt reports feeling like her speech is slurred. No slurred speech is noted in triage but pt is having some aphasia. Pt denies numbness or weakness on one side of her body but does states that she feel "weak inside" and also reports having a headache. Pt is in NAD.

## 2019-12-31 NOTE — Discharge Instructions (Signed)
Continue your current meds   See your neurologist, Dr. Sherryll Burger for follow up   Return to ER if you have worse dizziness, trouble speaking, weakness

## 2020-01-16 ENCOUNTER — Other Ambulatory Visit: Payer: Self-pay | Admitting: Pediatrics

## 2020-01-16 DIAGNOSIS — M7989 Other specified soft tissue disorders: Secondary | ICD-10-CM

## 2020-01-16 DIAGNOSIS — I10 Essential (primary) hypertension: Secondary | ICD-10-CM

## 2020-01-18 ENCOUNTER — Ambulatory Visit: Payer: Medicare Other

## 2020-04-04 IMAGING — MG DIGITAL SCREENING BILATERAL MAMMOGRAM WITH TOMO AND CAD
8 series · 8 of 24 positions shown · non-contrast
Comparison: Previous exam(s).

CLINICAL DATA: Screening.

EXAM:
DIGITAL SCREENING BILATERAL MAMMOGRAM WITH TOMO AND CAD

[R MLO synth-2D]
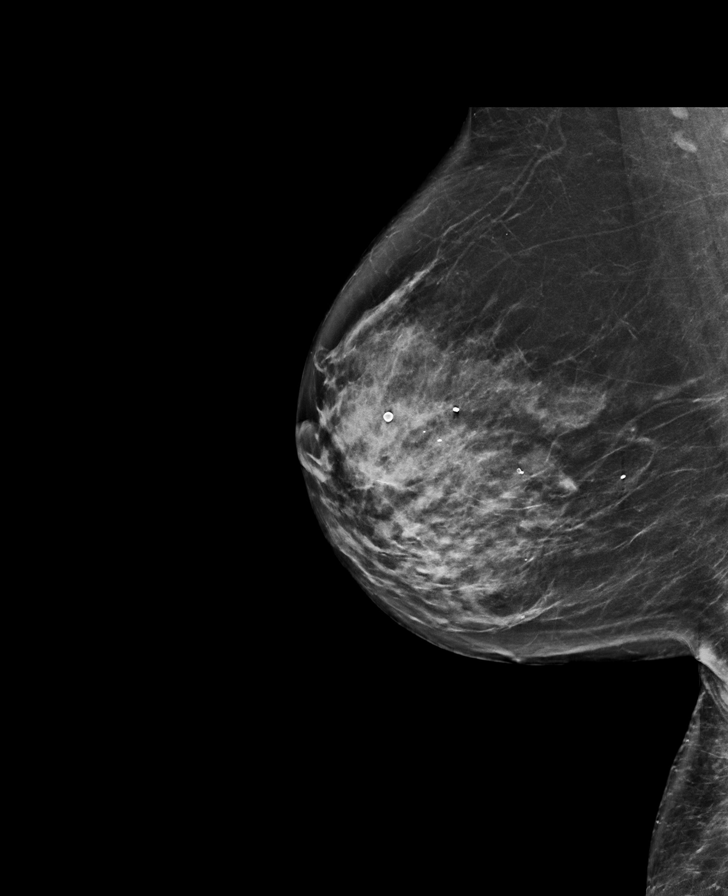

[R CC synth-2D]
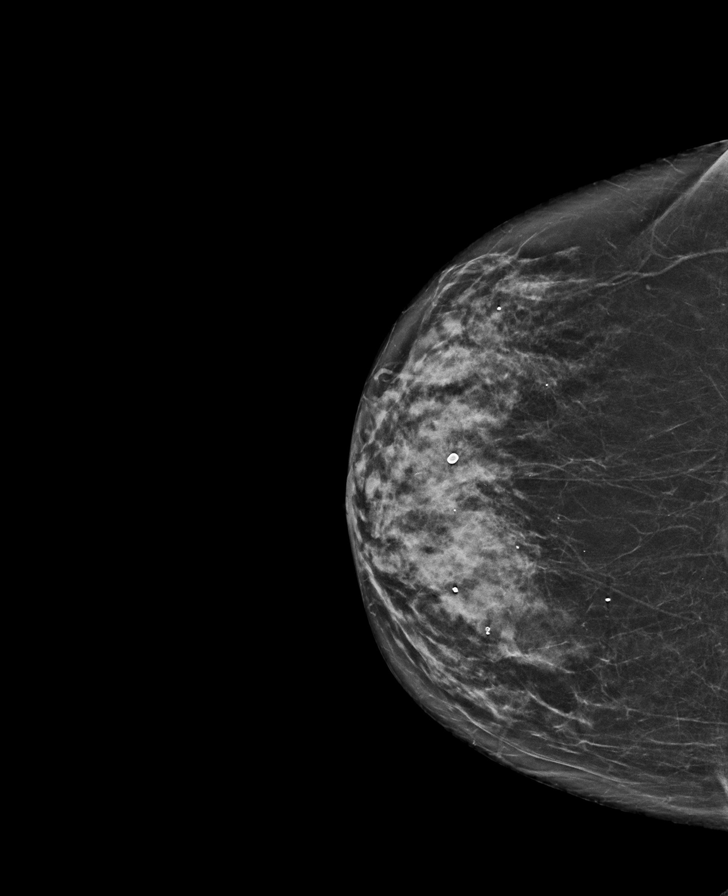

[L MLO synth-2D]
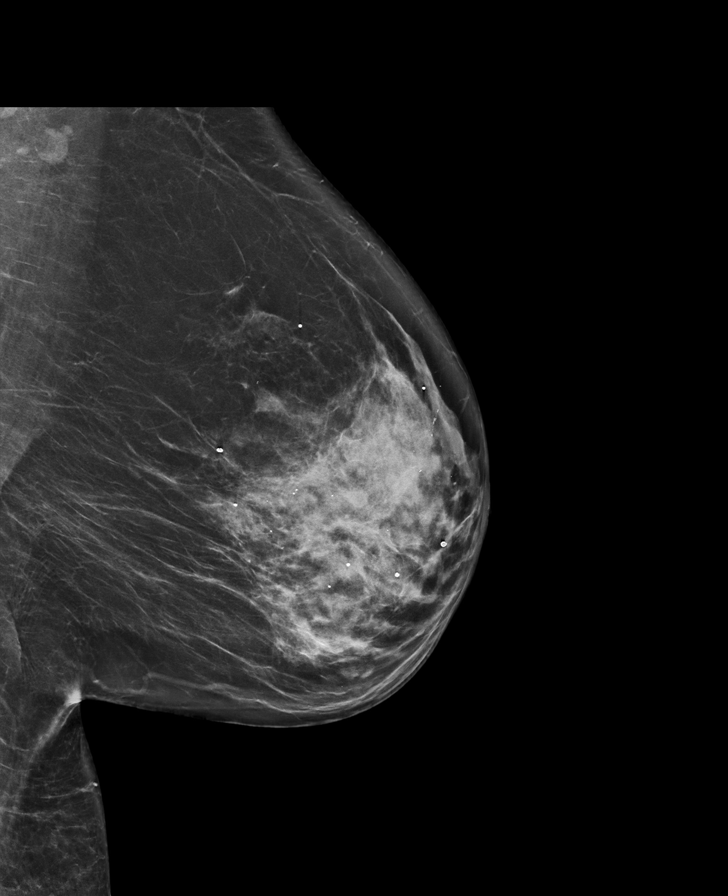

[L CC synth-2D]
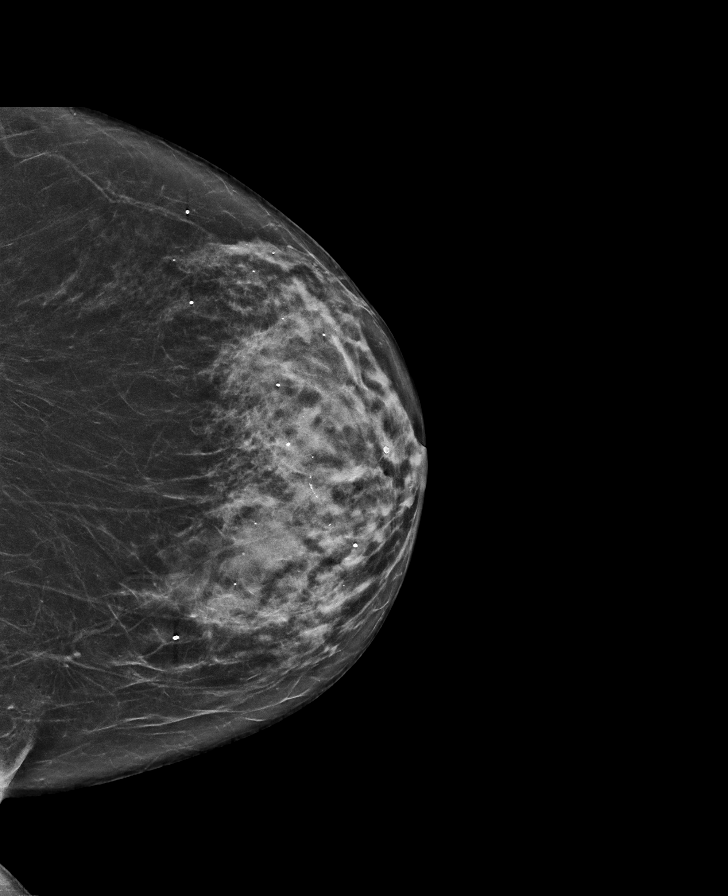

[R CC tomo · tomo slice 33/65.0]
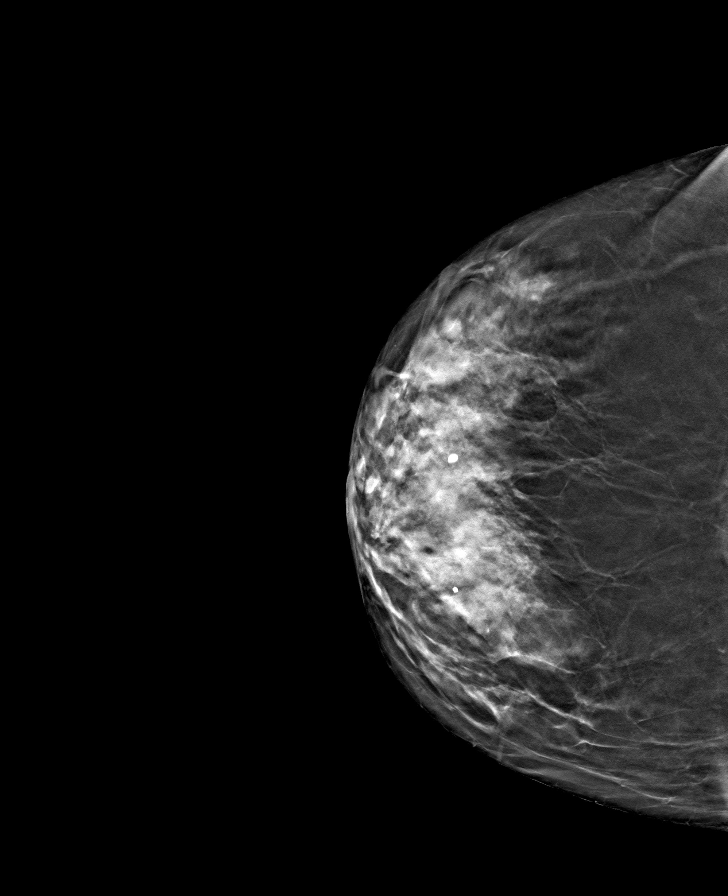

[L CC tomo · tomo slice 35/68.0]
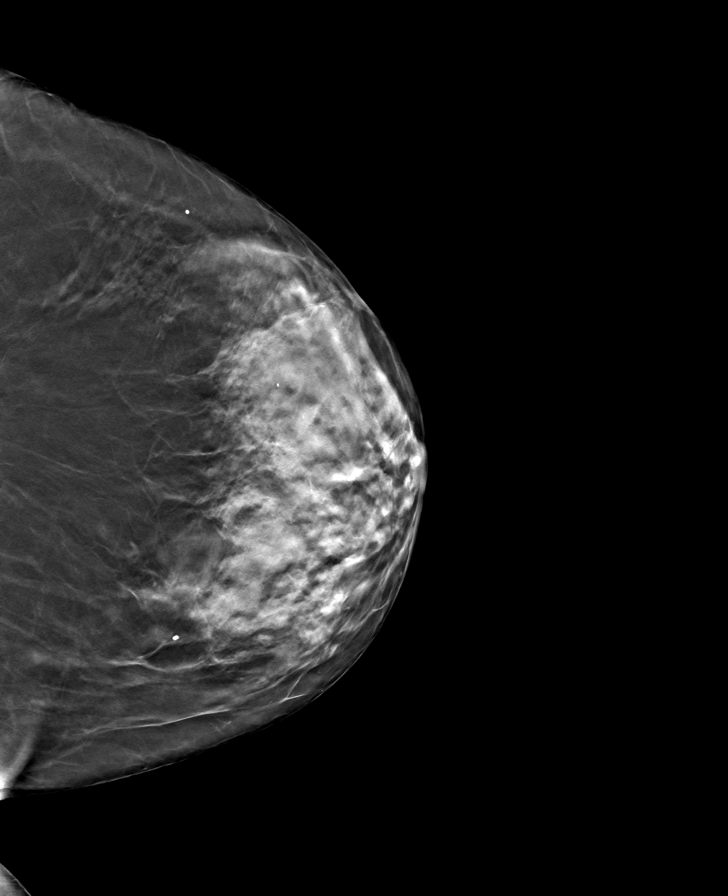

[L MLO tomo · tomo slice 40/79.0]
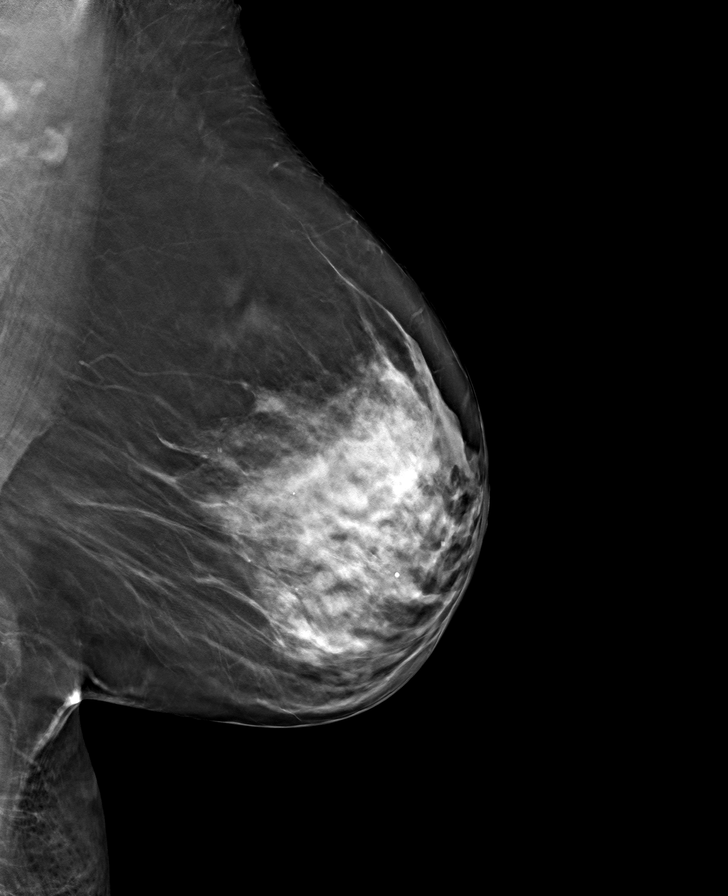

[R MLO tomo · tomo slice 39/77.0]
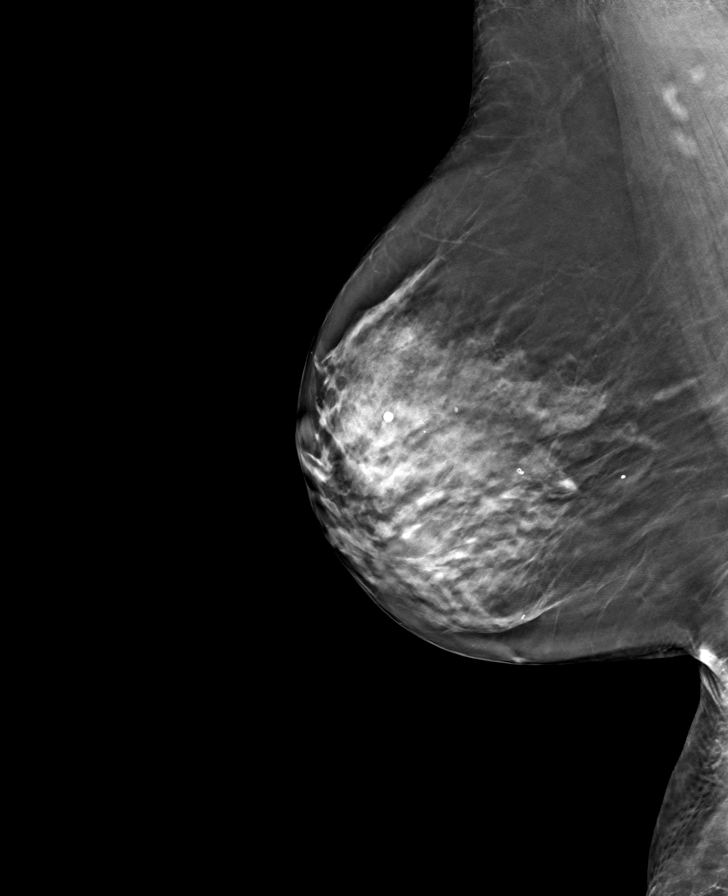

[8 of 24 positions shown; findings below may reference images not displayed]

ACR Breast Density Category c: The breast tissue is heterogeneously
dense, which may obscure small masses.
FINDINGS: There are no findings suspicious for malignancy. Images were
processed with CAD.
IMPRESSION: No mammographic evidence of malignancy. A result letter of this
screening mammogram will be mailed directly to the patient.

RECOMMENDATION:
Screening mammogram in one year. (Code:[id number])

BI-RADS CATEGORY  1: Negative.

## 2020-04-11 ENCOUNTER — Inpatient Hospital Stay: Admit: 2020-04-11 | Payer: Medicare Other

## 2020-04-11 ENCOUNTER — Other Ambulatory Visit: Payer: Self-pay

## 2020-04-11 ENCOUNTER — Inpatient Hospital Stay
Admission: EM | Admit: 2020-04-11 | Discharge: 2020-05-12 | DRG: 871 | Disposition: E | Payer: Medicare Other | Attending: Internal Medicine | Admitting: Internal Medicine

## 2020-04-11 ENCOUNTER — Emergency Department: Payer: Medicare Other

## 2020-04-11 ENCOUNTER — Inpatient Hospital Stay: Payer: Medicare Other

## 2020-04-11 ENCOUNTER — Inpatient Hospital Stay
Admit: 2020-04-11 | Discharge: 2020-04-11 | Disposition: A | Discharge status: Home / Self Care | Payer: Medicare Other | Attending: Critical Care Medicine | Admitting: Critical Care Medicine

## 2020-04-11 DIAGNOSIS — F17210 Nicotine dependence, cigarettes, uncomplicated: Secondary | ICD-10-CM | POA: Diagnosis present

## 2020-04-11 DIAGNOSIS — E785 Hyperlipidemia, unspecified: Secondary | ICD-10-CM | POA: Diagnosis present

## 2020-04-11 DIAGNOSIS — F0390 Unspecified dementia without behavioral disturbance: Secondary | ICD-10-CM | POA: Diagnosis present

## 2020-04-11 DIAGNOSIS — Z8673 Personal history of transient ischemic attack (TIA), and cerebral infarction without residual deficits: Secondary | ICD-10-CM | POA: Diagnosis not present

## 2020-04-11 DIAGNOSIS — E871 Hypo-osmolality and hyponatremia: Secondary | ICD-10-CM | POA: Diagnosis present

## 2020-04-11 DIAGNOSIS — E1122 Type 2 diabetes mellitus with diabetic chronic kidney disease: Secondary | ICD-10-CM | POA: Diagnosis present

## 2020-04-11 DIAGNOSIS — J9600 Acute respiratory failure, unspecified whether with hypoxia or hypercapnia: Secondary | ICD-10-CM

## 2020-04-11 DIAGNOSIS — Z7983 Long term (current) use of bisphosphonates: Secondary | ICD-10-CM

## 2020-04-11 DIAGNOSIS — A419 Sepsis, unspecified organism: Secondary | ICD-10-CM | POA: Diagnosis present

## 2020-04-11 DIAGNOSIS — J9601 Acute respiratory failure with hypoxia: Secondary | ICD-10-CM | POA: Diagnosis present

## 2020-04-11 DIAGNOSIS — Z20822 Contact with and (suspected) exposure to covid-19: Secondary | ICD-10-CM | POA: Diagnosis present

## 2020-04-11 DIAGNOSIS — E872 Acidosis, unspecified: Secondary | ICD-10-CM

## 2020-04-11 DIAGNOSIS — N179 Acute kidney failure, unspecified: Secondary | ICD-10-CM | POA: Diagnosis present

## 2020-04-11 DIAGNOSIS — E0810 Diabetes mellitus due to underlying condition with ketoacidosis without coma: Secondary | ICD-10-CM

## 2020-04-11 DIAGNOSIS — R6521 Severe sepsis with septic shock: Secondary | ICD-10-CM | POA: Diagnosis present

## 2020-04-11 DIAGNOSIS — E1110 Type 2 diabetes mellitus with ketoacidosis without coma: Secondary | ICD-10-CM | POA: Diagnosis present

## 2020-04-11 DIAGNOSIS — Z7982 Long term (current) use of aspirin: Secondary | ICD-10-CM | POA: Diagnosis not present

## 2020-04-11 DIAGNOSIS — N170 Acute kidney failure with tubular necrosis: Secondary | ICD-10-CM | POA: Diagnosis not present

## 2020-04-11 DIAGNOSIS — Z90710 Acquired absence of both cervix and uterus: Secondary | ICD-10-CM

## 2020-04-11 DIAGNOSIS — R571 Hypovolemic shock: Secondary | ICD-10-CM | POA: Diagnosis present

## 2020-04-11 DIAGNOSIS — K859 Acute pancreatitis without necrosis or infection, unspecified: Secondary | ICD-10-CM | POA: Diagnosis present

## 2020-04-11 DIAGNOSIS — K7200 Acute and subacute hepatic failure without coma: Secondary | ICD-10-CM | POA: Diagnosis not present

## 2020-04-11 DIAGNOSIS — Z66 Do not resuscitate: Secondary | ICD-10-CM | POA: Diagnosis present

## 2020-04-11 DIAGNOSIS — I129 Hypertensive chronic kidney disease with stage 1 through stage 4 chronic kidney disease, or unspecified chronic kidney disease: Secondary | ICD-10-CM | POA: Diagnosis present

## 2020-04-11 DIAGNOSIS — Z452 Encounter for adjustment and management of vascular access device: Secondary | ICD-10-CM

## 2020-04-11 DIAGNOSIS — M810 Age-related osteoporosis without current pathological fracture: Secondary | ICD-10-CM | POA: Diagnosis present

## 2020-04-11 DIAGNOSIS — K760 Fatty (change of) liver, not elsewhere classified: Secondary | ICD-10-CM | POA: Diagnosis present

## 2020-04-11 DIAGNOSIS — I248 Other forms of acute ischemic heart disease: Secondary | ICD-10-CM | POA: Diagnosis present

## 2020-04-11 DIAGNOSIS — N1831 Chronic kidney disease, stage 3a: Secondary | ICD-10-CM | POA: Diagnosis present

## 2020-04-11 DIAGNOSIS — R7989 Other specified abnormal findings of blood chemistry: Secondary | ICD-10-CM

## 2020-04-11 DIAGNOSIS — Z0189 Encounter for other specified special examinations: Secondary | ICD-10-CM

## 2020-04-11 DIAGNOSIS — E1121 Type 2 diabetes mellitus with diabetic nephropathy: Secondary | ICD-10-CM | POA: Diagnosis present

## 2020-04-11 DIAGNOSIS — Z79899 Other long term (current) drug therapy: Secondary | ICD-10-CM | POA: Diagnosis not present

## 2020-04-11 LAB — BLOOD GAS, ARTERIAL
Acid-base deficit: 26.6 mmol/L — ABNORMAL HIGH (ref 0.0–2.0)
Acid-base deficit: 16.5 mmol/L — ABNORMAL HIGH (ref 0.0–2.0)
Acid-base deficit: 22.9 mmol/L — ABNORMAL HIGH (ref 0.0–2.0)
Acid-base deficit: 14.6 mmol/L — ABNORMAL HIGH (ref 0.0–2.0)
Bicarbonate: 5.1 mmol/L — ABNORMAL LOW (ref 20.0–28.0)
Bicarbonate: 12.4 mmol/L — ABNORMAL LOW (ref 20.0–28.0)
Bicarbonate: 7.1 mmol/L — ABNORMAL LOW (ref 20.0–28.0)
Bicarbonate: 12.9 mmol/L — ABNORMAL LOW (ref 20.0–28.0)
FIO2: 0.28
FIO2: 0.5
FIO2: 0.36
FIO2: 0.35
MECHVT: 450 mL
MECHVT: 450 mL
MECHVT: 450 mL
Mechanical Rate: 16
O2 Saturation: 93.8 %
O2 Saturation: 98.6 %
O2 Saturation: 94.6 %
O2 Saturation: 92.2 %
PEEP: 5 cmH2O
PEEP: 5 cmH2O
PEEP: 5 cmH2O
Patient temperature: 37
Patient temperature: 37
Patient temperature: 37
Patient temperature: 35.8
RATE: 16 resp/min
RATE: 16 resp/min
RATE: 16 resp/min
pCO2 arterial: 25 mmHg — ABNORMAL LOW (ref 32.0–48.0)
pCO2 arterial: 39 mmHg (ref 32.0–48.0)
pCO2 arterial: 28 mmHg — ABNORMAL LOW (ref 32.0–48.0)
pCO2 arterial: 35 mmHg (ref 32.0–48.0)
pH, Arterial: 6.92 — CL (ref 7.350–7.450)
pH, Arterial: 7.11 — CL (ref 7.350–7.450)
pH, Arterial: 7.01 — CL (ref 7.350–7.450)
pH, Arterial: 7.17 — CL (ref 7.350–7.450)
pO2, Arterial: 112 mmHg — ABNORMAL HIGH (ref 83.0–108.0)
pO2, Arterial: 152 mmHg — ABNORMAL HIGH (ref 83.0–108.0)
pO2, Arterial: 108 mmHg (ref 83.0–108.0)
pO2, Arterial: 77 mmHg — ABNORMAL LOW (ref 83.0–108.0)

## 2020-04-11 LAB — LACTIC ACID, PLASMA
Lactic Acid, Venous: >11 mmol/L (ref 0.5–1.9)
Lactic Acid, Venous: >11 mmol/L (ref 0.5–1.9)
Lactic Acid, Venous: >11 mmol/L (ref 0.5–1.9)
Lactic Acid, Venous: >11 mmol/L (ref 0.5–1.9)

## 2020-04-11 LAB — HIV ANTIBODY (ROUTINE TESTING W REFLEX): HIV Screen 4th Generation wRfx: NONREACTIVE

## 2020-04-11 LAB — CBC WITH DIFFERENTIAL/PLATELET
Abs Immature Granulocytes: 0.1 10*3/uL — ABNORMAL HIGH (ref 0.00–0.07)
Basophils Absolute: 0 10*3/uL (ref 0.0–0.1)
Basophils Relative: 0 %
Eosinophils Absolute: 0.1 10*3/uL (ref 0.0–0.5)
Eosinophils Relative: 0 %
HCT: 45.9 % (ref 36.0–46.0)
Hemoglobin: 14.4 g/dL (ref 12.0–15.0)
Immature Granulocytes: 1 %
Lymphocytes Relative: 14 %
Lymphs Abs: 1.6 10*3/uL (ref 0.7–4.0)
MCH: 27.9 pg (ref 26.0–34.0)
MCHC: 31.4 g/dL (ref 30.0–36.0)
MCV: 88.8 fL (ref 80.0–100.0)
Monocytes Absolute: 0.9 10*3/uL (ref 0.1–1.0)
Monocytes Relative: 8 %
Neutro Abs: 8.9 10*3/uL — ABNORMAL HIGH (ref 1.7–7.7)
Neutrophils Relative %: 77 %
Platelets: 230 10*3/uL (ref 150–400)
RBC: 5.17 MIL/uL — ABNORMAL HIGH (ref 3.87–5.11)
RDW: 16.5 % — ABNORMAL HIGH (ref 11.5–15.5)
WBC: 11.5 10*3/uL — ABNORMAL HIGH (ref 4.0–10.5)
nRBC: 0 % (ref 0.0–0.2)

## 2020-04-11 LAB — ECHOCARDIOGRAM COMPLETE
Height: 64 in
Weight: 3132.3 oz

## 2020-04-11 LAB — BASIC METABOLIC PANEL
Anion gap: 27 — ABNORMAL HIGH (ref 5–15)
Anion gap: 24 — ABNORMAL HIGH (ref 5–15)
BUN: 40 mg/dL — ABNORMAL HIGH (ref 8–23)
BUN: 46 mg/dL — ABNORMAL HIGH (ref 8–23)
CO2: 10 mmol/L — ABNORMAL LOW (ref 22–32)
CO2: 14 mmol/L — ABNORMAL LOW (ref 22–32)
Calcium: 7.1 mg/dL — ABNORMAL LOW (ref 8.9–10.3)
Calcium: 6 mg/dL — CL (ref 8.9–10.3)
Chloride: 97 mmol/L — ABNORMAL LOW (ref 98–111)
Chloride: 99 mmol/L (ref 98–111)
Creatinine, Ser: 3.44 mg/dL — ABNORMAL HIGH (ref 0.44–1.00)
Creatinine, Ser: 3.65 mg/dL — ABNORMAL HIGH (ref 0.44–1.00)
GFR calc Af Amer: 15 mL/min — ABNORMAL LOW (ref >60)
GFR calc Af Amer: 14 mL/min — ABNORMAL LOW (ref >60)
GFR calc non Af Amer: 13 mL/min — ABNORMAL LOW (ref >60)
GFR calc non Af Amer: 12 mL/min — ABNORMAL LOW (ref >60)
Glucose, Bld: 414 mg/dL — ABNORMAL HIGH (ref 70–99)
Glucose, Bld: 315 mg/dL — ABNORMAL HIGH (ref 70–99)
Potassium: 4.9 mmol/L (ref 3.5–5.1)
Potassium: 5.1 mmol/L (ref 3.5–5.1)
Sodium: 134 mmol/L — ABNORMAL LOW (ref 135–145)
Sodium: 137 mmol/L (ref 135–145)

## 2020-04-11 LAB — RENAL FUNCTION PANEL
Albumin: <1 g/dL — ABNORMAL LOW (ref 3.5–5.0)
Anion gap: 32 — ABNORMAL HIGH (ref 5–15)
BUN: 39 mg/dL — ABNORMAL HIGH (ref 8–23)
CO2: 15 mmol/L — ABNORMAL LOW (ref 22–32)
Calcium: 5.2 mg/dL — CL (ref 8.9–10.3)
Chloride: 103 mmol/L (ref 98–111)
Creatinine, Ser: 3.7 mg/dL — ABNORMAL HIGH (ref 0.44–1.00)
GFR calc Af Amer: 14 mL/min — ABNORMAL LOW (ref >60)
GFR calc non Af Amer: 12 mL/min — ABNORMAL LOW (ref >60)
Glucose, Bld: 137 mg/dL — ABNORMAL HIGH (ref 70–99)
Phosphorus: 8.9 mg/dL — ABNORMAL HIGH (ref 2.5–4.6)
Potassium: 4.6 mmol/L (ref 3.5–5.1)
Sodium: 150 mmol/L — ABNORMAL HIGH (ref 135–145)

## 2020-04-11 LAB — COMPREHENSIVE METABOLIC PANEL
ALT: 497 U/L — ABNORMAL HIGH (ref 0–44)
AST: 887 U/L — ABNORMAL HIGH (ref 15–41)
Albumin: 3.5 g/dL (ref 3.5–5.0)
Alkaline Phosphatase: 98 U/L (ref 38–126)
Anion gap: 28 — ABNORMAL HIGH (ref 5–15)
BUN: 38 mg/dL — ABNORMAL HIGH (ref 8–23)
CO2: 8 mmol/L — ABNORMAL LOW (ref 22–32)
Calcium: 7.8 mg/dL — ABNORMAL LOW (ref 8.9–10.3)
Chloride: 97 mmol/L — ABNORMAL LOW (ref 98–111)
Creatinine, Ser: 3.4 mg/dL — ABNORMAL HIGH (ref 0.44–1.00)
GFR calc Af Amer: 15 mL/min — ABNORMAL LOW (ref >60)
GFR calc non Af Amer: 13 mL/min — ABNORMAL LOW (ref >60)
Glucose, Bld: 528 mg/dL (ref 70–99)
Potassium: 4.4 mmol/L (ref 3.5–5.1)
Sodium: 133 mmol/L — ABNORMAL LOW (ref 135–145)
Total Bilirubin: 1.3 mg/dL — ABNORMAL HIGH (ref 0.3–1.2)
Total Protein: 7.3 g/dL (ref 6.5–8.1)

## 2020-04-11 LAB — URINALYSIS, COMPLETE (UACMP) WITH MICROSCOPIC
Bacteria, UA: NONE SEEN
Bilirubin Urine: NEGATIVE
Glucose, UA: NEGATIVE mg/dL
Hgb urine dipstick: NEGATIVE
Ketones, ur: NEGATIVE mg/dL
Leukocytes,Ua: NEGATIVE
Nitrite: NEGATIVE
Protein, ur: NEGATIVE mg/dL
Specific Gravity, Urine: 1.017 (ref 1.005–1.030)
pH: 7 (ref 5.0–8.0)

## 2020-04-11 LAB — BETA-HYDROXYBUTYRIC ACID
Beta-Hydroxybutyric Acid: 0.27 mmol/L (ref 0.05–0.27)
Beta-Hydroxybutyric Acid: 0.3 mmol/L — ABNORMAL HIGH (ref 0.05–0.27)
Beta-Hydroxybutyric Acid: 0.73 mmol/L — ABNORMAL HIGH (ref 0.05–0.27)

## 2020-04-11 LAB — LIPID PANEL
Cholesterol: 114 mg/dL (ref 0–200)
HDL: 34 mg/dL — ABNORMAL LOW (ref >40)
LDL Cholesterol: 59 mg/dL (ref 0–99)
Total CHOL/HDL Ratio: 3.4 RATIO
Triglycerides: 106 mg/dL (ref <150)
VLDL: 21 mg/dL (ref 0–40)

## 2020-04-11 LAB — GLUCOSE, CAPILLARY
Glucose-Capillary: 399 mg/dL — ABNORMAL HIGH (ref 70–99)
Glucose-Capillary: 320 mg/dL — ABNORMAL HIGH (ref 70–99)
Glucose-Capillary: 283 mg/dL — ABNORMAL HIGH (ref 70–99)
Glucose-Capillary: 285 mg/dL — ABNORMAL HIGH (ref 70–99)
Glucose-Capillary: 241 mg/dL — ABNORMAL HIGH (ref 70–99)
Glucose-Capillary: 125 mg/dL — ABNORMAL HIGH (ref 70–99)
Glucose-Capillary: 124 mg/dL — ABNORMAL HIGH (ref 70–99)
Glucose-Capillary: 186 mg/dL — ABNORMAL HIGH (ref 70–99)
Glucose-Capillary: 54 mg/dL — ABNORMAL LOW (ref 70–99)
Glucose-Capillary: 108 mg/dL — ABNORMAL HIGH (ref 70–99)

## 2020-04-11 LAB — TROPONIN I (HIGH SENSITIVITY): Troponin I (High Sensitivity): 602 ng/L (ref <18)

## 2020-04-11 LAB — CREATININE, SERUM
Creatinine, Ser: 3.28 mg/dL — ABNORMAL HIGH (ref 0.44–1.00)
GFR calc Af Amer: 16 mL/min — ABNORMAL LOW (ref >60)
GFR calc non Af Amer: 14 mL/min — ABNORMAL LOW (ref >60)

## 2020-04-11 LAB — CBC
HCT: 40.4 % (ref 36.0–46.0)
Hemoglobin: 13.3 g/dL (ref 12.0–15.0)
MCH: 28.3 pg (ref 26.0–34.0)
MCHC: 32.9 g/dL (ref 30.0–36.0)
MCV: 86 fL (ref 80.0–100.0)
Platelets: 156 10*3/uL (ref 150–400)
RBC: 4.7 MIL/uL (ref 3.87–5.11)
RDW: 16.4 % — ABNORMAL HIGH (ref 11.5–15.5)
WBC: 10.3 10*3/uL (ref 4.0–10.5)
nRBC: 0 % (ref 0.0–0.2)

## 2020-04-11 LAB — ACETAMINOPHEN LEVEL: Acetaminophen (Tylenol), Serum: <10 ug/mL — ABNORMAL LOW (ref 10–30)

## 2020-04-11 LAB — BRAIN NATRIURETIC PEPTIDE: B Natriuretic Peptide: 229.7 pg/mL — ABNORMAL HIGH (ref 0.0–100.0)

## 2020-04-11 LAB — LIPASE, BLOOD: Lipase: 3293 U/L — ABNORMAL HIGH (ref 11–51)

## 2020-04-11 LAB — SARS CORONAVIRUS 2 BY RT PCR (HOSPITAL ORDER, PERFORMED IN ~~LOC~~ HOSPITAL LAB): SARS Coronavirus 2: NEGATIVE

## 2020-04-11 LAB — PROTIME-INR
INR: 1.3 — ABNORMAL HIGH (ref 0.8–1.2)
Prothrombin Time: 15.2 seconds (ref 11.4–15.2)

## 2020-04-11 LAB — PROCALCITONIN: Procalcitonin: 4.84 ng/mL

## 2020-04-11 LAB — ETHANOL: Alcohol, Ethyl (B): <10 mg/dL (ref <10)

## 2020-04-11 MED ORDER — SODIUM BICARBONATE 8.4 % IV SOLN: 250.0000 meq | Freq: Once | INTRAVENOUS | Status: AC

## 2020-04-11 MED ORDER — LACTATED RINGERS IV BOLUS (SEPSIS)
500.0000 mL | Freq: Once | INTRAVENOUS | Status: AC
  Administered 2020-04-11: 500 mL via INTRAVENOUS

## 2020-04-11 MED ORDER — METRONIDAZOLE IN NACL 5-0.79 MG/ML-% IV SOLN
500.0000 mg | Freq: Once | INTRAVENOUS | Status: AC
  Administered 2020-04-11: 500 mg via INTRAVENOUS
  Filled 2020-04-11: qty 100

## 2020-04-11 MED ORDER — DEXTROSE 10 % IV SOLN: INTRAVENOUS | Status: DC

## 2020-04-11 MED ORDER — NOREPINEPHRINE 4 MG/250ML-% IV SOLN
INTRAVENOUS | Status: AC
  Filled 2020-04-11: qty 250

## 2020-04-11 MED ORDER — PIPERACILLIN-TAZOBACTAM IN DEX 2-0.25 GM/50ML IV SOLN
2.2500 g | Freq: Three times a day (TID) | INTRAVENOUS | Status: DC
  Administered 2020-04-11: 2.25 g via INTRAVENOUS
  Filled 2020-04-11 (×2): qty 50

## 2020-04-11 MED ORDER — SODIUM BICARBONATE 8.4 % IV SOLN
INTRAVENOUS | Status: AC
  Administered 2020-04-11: 100 meq via INTRAVENOUS
  Filled 2020-04-11: qty 100

## 2020-04-11 MED ORDER — MORPHINE SULFATE (PF) 2 MG/ML IV SOLN: 1.0000 mg | INTRAVENOUS | Status: DC | PRN

## 2020-04-11 MED ORDER — DOCUSATE SODIUM 100 MG PO CAPS: 100.0000 mg | ORAL_CAPSULE | Freq: Two times a day (BID) | ORAL | Status: DC | PRN

## 2020-04-11 MED ORDER — CALCIUM GLUCONATE-NACL 2-0.675 GM/100ML-% IV SOLN
2.0000 g | Freq: Once | INTRAVENOUS | Status: AC
  Administered 2020-04-11: 2000 mg via INTRAVENOUS
  Filled 2020-04-11: qty 100

## 2020-04-11 MED ORDER — NOREPINEPHRINE 4 MG/250ML-% IV SOLN
0.0000 ug/min | INTRAVENOUS | Status: DC
  Administered 2020-04-11: 5 ug/min via INTRAVENOUS
  Filled 2020-04-11: qty 250

## 2020-04-11 MED ORDER — IPRATROPIUM-ALBUTEROL 0.5-2.5 (3) MG/3ML IN SOLN: 3.0000 mL | Freq: Four times a day (QID) | RESPIRATORY_TRACT | Status: DC | PRN

## 2020-04-11 MED ORDER — HYDROCORTISONE NA SUCCINATE PF 100 MG IJ SOLR
50.0000 mg | Freq: Four times a day (QID) | INTRAMUSCULAR | Status: DC
  Administered 2020-04-11 (×2): 50 mg via INTRAVENOUS
  Filled 2020-04-11 (×2): qty 2

## 2020-04-11 MED ORDER — FENTANYL CITRATE (PF) 100 MCG/2ML IJ SOLN
INTRAMUSCULAR | Status: AC
  Filled 2020-04-11: qty 4

## 2020-04-11 MED ORDER — HEPARIN SODIUM (PORCINE) 5000 UNIT/ML IJ SOLN: 5000.0000 [IU] | Freq: Three times a day (TID) | INTRAMUSCULAR | Status: DC

## 2020-04-11 MED ORDER — LACTATED RINGERS IV BOLUS (SEPSIS)
1000.0000 mL | Freq: Once | INTRAVENOUS | Status: AC
  Administered 2020-04-11: 1000 mL via INTRAVENOUS

## 2020-04-11 MED ORDER — CHLORHEXIDINE GLUCONATE CLOTH 2 % EX PADS: 6.0000 | MEDICATED_PAD | Freq: Every day | CUTANEOUS | Status: DC

## 2020-04-11 MED ORDER — SODIUM CHLORIDE 0.9 % IV SOLN
2.0000 g | Freq: Once | INTRAVENOUS | Status: AC
  Administered 2020-04-11: 2 g via INTRAVENOUS
  Filled 2020-04-11: qty 2

## 2020-04-11 MED ORDER — PRISMASOL BGK 0/2.5 32-2.5 MEQ/L IV SOLN
INTRAVENOUS | Status: DC
  Filled 2020-04-11: qty 5000

## 2020-04-11 MED ORDER — DEXTROSE-NACL 5-0.45 % IV SOLN: INTRAVENOUS | Status: DC

## 2020-04-11 MED ORDER — LACTATED RINGERS IV BOLUS
1000.0000 mL | Freq: Once | INTRAVENOUS | Status: AC
  Administered 2020-04-11: 1000 mL via INTRAVENOUS

## 2020-04-11 MED ORDER — PIPERACILLIN-TAZOBACTAM 3.375 G IVPB 30 MIN
3.3750 g | Freq: Four times a day (QID) | INTRAVENOUS | Status: DC
  Filled 2020-04-11 (×4): qty 50

## 2020-04-11 MED ORDER — VASOPRESSIN 20 UNIT/ML IV SOLN
0.0300 [IU]/min | INTRAVENOUS | Status: DC
  Administered 2020-04-11: 0.03 [IU]/min via INTRAVENOUS
  Filled 2020-04-11: qty 2

## 2020-04-11 MED ORDER — INSULIN REGULAR(HUMAN) IN NACL 100-0.9 UT/100ML-% IV SOLN
INTRAVENOUS | Status: DC
  Administered 2020-04-11: 5 [IU]/h via INTRAVENOUS
  Filled 2020-04-11: qty 100

## 2020-04-11 MED ORDER — SODIUM CHLORIDE 0.9 % IV BOLUS
1000.0000 mL | Freq: Once | INTRAVENOUS | Status: AC
  Administered 2020-04-11: 1000 mL via INTRAVENOUS

## 2020-04-11 MED ORDER — FENTANYL CITRATE (PF) 100 MCG/2ML IJ SOLN
200.0000 ug | Freq: Once | INTRAMUSCULAR | Status: AC
  Administered 2020-04-11: 200 ug via INTRAVENOUS

## 2020-04-11 MED ORDER — SODIUM CHLORIDE 0.9 % IV SOLN
0.0000 ug/min | INTRAVENOUS | Status: DC
  Administered 2020-04-11: 100 ug/min via INTRAVENOUS
  Filled 2020-04-11 (×2): qty 1
  Filled 2020-04-11: qty 10
  Filled 2020-04-11: qty 1

## 2020-04-11 MED ORDER — SODIUM BICARBONATE 8.4 % IV SOLN
50.0000 meq | Freq: Once | INTRAVENOUS | Status: AC
  Administered 2020-04-11: 50 meq via INTRAVENOUS

## 2020-04-11 MED ORDER — CALCIUM GLUCONATE-NACL 2-0.675 GM/100ML-% IV SOLN: 2.0000 g | Freq: Once | INTRAVENOUS | Status: DC

## 2020-04-11 MED ORDER — SODIUM BICARBONATE 8.4 % IV SOLN
INTRAVENOUS | Status: DC
Start: 2020-04-11 — End: 2020-04-11

## 2020-04-11 MED ORDER — STERILE WATER FOR INJECTION IV SOLN
INTRAVENOUS | Status: DC
  Filled 2020-04-11: qty 850
  Filled 2020-04-11 (×3): qty 150
  Filled 2020-04-11 (×3): qty 850

## 2020-04-11 MED ORDER — DEXTROSE 50 % IV SOLN
0.0000 mL | INTRAVENOUS | Status: DC | PRN
  Administered 2020-04-11: 50 mL via INTRAVENOUS
  Filled 2020-04-11: qty 50

## 2020-04-11 MED ORDER — SODIUM BICARBONATE 8.4 % IV SOLN
INTRAVENOUS | Status: AC
  Administered 2020-04-11: 100 meq via INTRAVENOUS
  Filled 2020-04-11: qty 50

## 2020-04-11 MED ORDER — MIDAZOLAM HCL 2 MG/2ML IJ SOLN
INTRAMUSCULAR | Status: AC
  Filled 2020-04-11: qty 4

## 2020-04-11 MED ORDER — FENTANYL 2500MCG IN NS 250ML (10MCG/ML) PREMIX INFUSION
0.0000 ug/h | INTRAVENOUS | Status: DC
  Administered 2020-04-11: 100 ug/h via INTRAVENOUS
  Filled 2020-04-11: qty 250

## 2020-04-11 MED ORDER — SODIUM BICARBONATE 8.4 % IV SOLN
INTRAVENOUS | Status: AC
  Filled 2020-04-11: qty 50

## 2020-04-11 MED ORDER — ONDANSETRON HCL 4 MG/2ML IJ SOLN: 4.0000 mg | Freq: Four times a day (QID) | INTRAMUSCULAR | Status: DC | PRN

## 2020-04-11 MED ORDER — SODIUM BICARBONATE 8.4 % IV SOLN: 100.0000 meq | Freq: Once | INTRAVENOUS | Status: AC

## 2020-04-11 MED ORDER — VANCOMYCIN HCL 1500 MG/300ML IV SOLN
1500.0000 mg | Freq: Once | INTRAVENOUS | Status: AC
  Administered 2020-04-11: 1500 mg via INTRAVENOUS
  Filled 2020-04-11: qty 300

## 2020-04-11 MED ORDER — POTASSIUM CHLORIDE 10 MEQ/50ML IV SOLN
10.0000 meq | INTRAVENOUS | Status: DC
  Filled 2020-04-11 (×4): qty 50

## 2020-04-11 MED ORDER — PHENYLEPHRINE CONCENTRATED 100MG/250ML (0.4 MG/ML) INFUSION SIMPLE
0.0000 ug/min | INTRAVENOUS | Status: DC
  Administered 2020-04-11: 400 ug/min via INTRAVENOUS
  Filled 2020-04-11: qty 250

## 2020-04-11 MED ORDER — SODIUM CHLORIDE 0.9 % IV SOLN: INTRAVENOUS | Status: DC

## 2020-04-11 MED ORDER — SODIUM BICARBONATE 8.4 % IV SOLN
50.0000 meq | Freq: Once | INTRAVENOUS | Status: AC
  Administered 2020-04-11: 50 meq via INTRAVENOUS
  Filled 2020-04-11: qty 50

## 2020-04-11 MED ORDER — SODIUM BICARBONATE 8.4 % IV SOLN
100.0000 meq | Freq: Once | INTRAVENOUS | Status: AC
  Administered 2020-04-11: 100 meq via INTRAVENOUS

## 2020-04-11 MED ORDER — FAMOTIDINE IN NACL 20-0.9 MG/50ML-% IV SOLN
20.0000 mg | INTRAVENOUS | Status: DC
  Administered 2020-04-11: 20 mg via INTRAVENOUS
  Filled 2020-04-11: qty 50

## 2020-04-11 MED ORDER — SODIUM BICARBONATE 8.4 % IV SOLN
INTRAVENOUS | Status: AC
  Administered 2020-04-11: 250 meq via INTRAVENOUS
  Filled 2020-04-11: qty 100

## 2020-04-11 MED ORDER — CALCIUM GLUCONATE-NACL 1-0.675 GM/50ML-% IV SOLN
1.0000 g | Freq: Once | INTRAVENOUS | Status: AC
  Administered 2020-04-11: 1000 mg via INTRAVENOUS
  Filled 2020-04-11: qty 50

## 2020-04-11 MED ORDER — VANCOMYCIN HCL IN DEXTROSE 1-5 GM/200ML-% IV SOLN: 1000.0000 mg | Freq: Once | INTRAVENOUS | Status: DC

## 2020-04-11 MED ORDER — PANTOPRAZOLE SODIUM 40 MG IV SOLR
40.0000 mg | Freq: Two times a day (BID) | INTRAVENOUS | Status: DC
  Filled 2020-04-11: qty 40

## 2020-04-11 MED ORDER — POLYETHYLENE GLYCOL 3350 17 G PO PACK: 17.0000 g | PACK | Freq: Every day | ORAL | Status: DC | PRN

## 2020-04-11 MED ORDER — HEPARIN SODIUM (PORCINE) 5000 UNIT/ML IJ SOLN
5000.0000 [IU] | Freq: Three times a day (TID) | INTRAMUSCULAR | Status: DC
  Administered 2020-04-11: 5000 [IU] via SUBCUTANEOUS
  Filled 2020-04-11: qty 1

## 2020-04-11 MED ORDER — VECURONIUM BROMIDE 10 MG IV SOLR
INTRAVENOUS | Status: AC
  Filled 2020-04-11: qty 10

## 2020-04-11 MED ORDER — MIDAZOLAM HCL 2 MG/2ML IJ SOLN: 4.0000 mg | Freq: Once | INTRAMUSCULAR | Status: AC

## 2020-04-11 MED ORDER — NOREPINEPHRINE 16 MG/250ML-% IV SOLN
0.0000 ug/min | INTRAVENOUS | Status: DC
  Administered 2020-04-11: 80 ug/min via INTRAVENOUS
  Administered 2020-04-11: 30 ug/min via INTRAVENOUS
  Administered 2020-04-11: 80 ug/min via INTRAVENOUS
  Filled 2020-04-11 (×8): qty 250

## 2020-04-11 MED ORDER — LACTATED RINGERS IV BOLUS: 1000.0000 mL | Freq: Once | INTRAVENOUS | Status: DC

## 2020-04-11 MED ORDER — VECURONIUM BROMIDE 10 MG IV SOLR
10.0000 mg | Freq: Once | INTRAVENOUS | Status: AC
  Administered 2020-04-11: 10 mg via INTRAVENOUS

## 2020-04-11 MED ORDER — HEPARIN SODIUM (PORCINE) 1000 UNIT/ML DIALYSIS
1000.0000 [IU] | INTRAMUSCULAR | Status: DC | PRN
  Administered 2020-04-11 (×2): 1400 [IU] via INTRAVENOUS_CENTRAL
  Filled 2020-04-11: qty 6
  Filled 2020-04-11: qty 4
  Filled 2020-04-11: qty 6

## 2020-04-11 MED ORDER — STERILE WATER FOR INJECTION IV SOLN
INTRAVENOUS | Status: DC
  Filled 2020-04-11 (×4): qty 150

## 2020-04-12 LAB — URINE CULTURE: Culture: NO GROWTH

## 2020-04-12 LAB — HEMOGLOBIN A1C
Hgb A1c MFr Bld: 5.8 % — ABNORMAL HIGH (ref 4.8–5.6)
Hgb A1c MFr Bld: 5.9 % — ABNORMAL HIGH (ref 4.8–5.6)
Mean Plasma Glucose: 120 mg/dL
Mean Plasma Glucose: 123 mg/dL

## 2020-04-12 LAB — BASIC METABOLIC PANEL
Anion gap: 26 — ABNORMAL HIGH (ref 5–15)
BUN: 31 mg/dL — ABNORMAL HIGH (ref 8–23)
CO2: 16 mmol/L — ABNORMAL LOW (ref 22–32)
Calcium: 6 mg/dL — CL (ref 8.9–10.3)
Chloride: 107 mmol/L (ref 98–111)
Creatinine, Ser: 3.1 mg/dL — ABNORMAL HIGH (ref 0.44–1.00)
GFR calc Af Amer: 17 mL/min — ABNORMAL LOW (ref >60)
GFR calc non Af Amer: 15 mL/min — ABNORMAL LOW (ref >60)
Glucose, Bld: 61 mg/dL — ABNORMAL LOW (ref 70–99)
Potassium: 3.5 mmol/L (ref 3.5–5.1)
Sodium: 149 mmol/L — ABNORMAL HIGH (ref 135–145)

## 2020-04-12 LAB — HEPATITIS B CORE ANTIBODY, TOTAL: Hep B Core Total Ab: NONREACTIVE

## 2020-04-12 LAB — HEPATITIS B CORE ANTIBODY, IGM: Hep B C IgM: NONREACTIVE

## 2020-04-12 LAB — HEPATITIS B SURFACE ANTIGEN: Hepatitis B Surface Ag: NONREACTIVE

## 2020-04-12 LAB — HEPATITIS A ANTIBODY, IGM: Hep A IgM: NONREACTIVE

## 2020-04-13 LAB — HCV INTERPRETATION

## 2020-04-13 LAB — HEPATITIS C VRS RNA DETECT BY PCR-QUAL: Hepatitis C Vrs RNA by PCR-Qual: NEGATIVE

## 2020-04-13 LAB — HCV AB W REFLEX TO QUANT PCR: HCV Ab: <0.1 s/co ratio (ref 0.0–0.9)

## 2020-04-16 LAB — CULTURE, BLOOD (ROUTINE X 2)
Culture: NO GROWTH
Culture: NO GROWTH
Special Requests: ADEQUATE

## 2020-04-16 LAB — GLUCOSE, CAPILLARY: Glucose-Capillary: 31 mg/dL — CL (ref 70–99)

## 2020-05-12 NOTE — Progress Notes (Signed)
Pt remains critically ill with multiorgan failure, remains hypotensive despite 3 vasopressors at max doses.  Also remains severely acidotic despite CRRT and Bicarb gtt at 200 ml/hr.  Pt's prognosis is extremely poor, high risk for cardiac arrest and death.  Likely will not survive the next 12-24 hrs.  Pt's daughter at bedside, we discussed her mother's critical illness with multiorgan failure despite max dose vasopressors, IVF, antibiotics, Bicarb, and CRRT.  She understands her mother's critical illness and that she is not likely to survive.  At this time she would like to continue all aggressive medical interventions and she what happens.  She is not ready to withdraw care at this time, and is waiting for other family to arrive to help her make further decisions.  Will continue care, she confirms DNR/DNI status. Chaplain called to bedside to offer support.   She gives consent to place [employer] for additional IV access and for Arterial line placement of BP monitoring.    Harlon Ditty, AGACNP-BC [facility] Pager: [phone]

## 2020-05-12 NOTE — Progress Notes (Signed)
The Clinical status was relayed to family in detail to daughter and sister(both are HCPOA)  Updated and notified of patients medical condition.  Patient remains unresponsive and will not open eyes to command.   Upon assessment his breath sounds are course crackles with significant secretions to his oral pharyngeal region.   patient with increased WOB and using accessory muscles to breathe  Explained to family course of therapy and the modalities     Patient with Progressive multiorgan failure with very low chance of meaningful recovery despite all aggressive and optimal medical therapy.  Patient is in the dying  Process associated with suffering.  Family understands the situation they called back and relayed to ICU team of wishes  They have consented and agreed to DNR status  Family are satisfied with Plan of action and management. All questions answered  Additional CC time 32 mins   Elain Wixon Santiago Glad, M.D.  [facility]  Medical Director [facility] Medical Director [facility] Cardio-Pulmonary Department

## 2020-05-12 NOTE — ED Notes (Signed)
This RN took pt to CT and back via stretcher and on monitor

## 2020-05-12 NOTE — ED Notes (Signed)
Pt taken to floor with RN and ED tech on monitor via stretcher. Bicarb infusion tubed to ICU. Daughter in ICU waiting room and  ICU RN notified.

## 2020-05-12 NOTE — Progress Notes (Signed)
Shift summary:  Pt arrived to ICU at shift change, on assessment pt RR in the 30's and diaphoretic. Pt not able to complete sentences, bipap placed and assisted MD with vascath insertion for CRRT. Shortly after pt became obtunded with agonal breathing and hypotensive. Pt intubated at 1030 with no complications. Pt started on levo and vaso with little improvement in blood pressure. CRRT initially started at 1555, however filter clotted and went through two more filter sets. Successfully started at 1857. 7 amps of bicarb given with successful rise in blood pressure. Neo ordered, but not yet started.

## 2020-05-12 NOTE — Progress Notes (Signed)
PHARMACY -  BRIEF ANTIBIOTIC NOTE   Pharmacy has received consult(s) for vanc/cefepime from an ED provider.  The patient's profile has been reviewed for ht/wt/allergies/indication/available labs.    One time order(s) placed for vanc 1.5g IV load and cefepime 2g IV x 1.  Further antibiotics/pharmacy consults should be ordered by admitting physician if indicated.                       Thank you,  Thomasene Ripple, PharmD, BCPS Clinical Pharmacist 04-27-20  3:14 AM

## 2020-05-12 NOTE — Procedures (Signed)
Endotracheal Intubation: Patient required placement of an artificial airway secondary to Respiratory Failure  Consent: Emergent.   Hand washing performed prior to starting the procedure.   Medications administered for sedation prior to procedure:  Midazolam 4 mg IV,  Vecuronium 10 mg IV, Fentanyl 100 mcg IV.    A time out procedure was called and correct patient, name, & ID confirmed. Needed supplies and equipment were assembled and checked to include ETT, 10 ml syringe, Glidescope, Mac and Miller blades, suction, oxygen and bag mask valve, end tidal CO2 monitor.   Patient was positioned to align the mouth and pharynx to facilitate visualization of the glottis.   Heart rate, SpO2 and blood pressure was continuously monitored during the procedure. Pre-oxygenation was conducted prior to intubation and endotracheal tube was placed through the vocal cords into the trachea.     The artificial airway was placed under direct visualization via glidescope route using a 8.0 ETT on the first attempt.  ETT was secured at 23 cm mark.  Placement was confirmed by auscuitation of lungs with good breath sounds bilaterally and no stomach sounds.  Condensation was noted on endotracheal tube.   Pulse ox 98%.  CO2 detector in place with appropriate color change.   Complications: None .   Operator: Keelee Yankey.   Chest radiograph ordered and pending.   Comments: OGT placed via glidescope.  Shawntae Lowy David Khelani Kops, M.D.   Pulmonary & Critical Care Medicine  Medical Director ICU-[facility] Medical Director [facility] Cardio-Pulmonary Department       

## 2020-05-12 NOTE — Procedures (Addendum)
Central Venous Catheter Insertion Procedure Note  Kannon Baum  [id number]  1951-05-16  Date:2020/04/22  Time:8:41 PM    Provider Performing: Judithe Modest    Procedure: Insertion of Arterial Line (11657) with US guidance (90383)   Indication(s) Blood pressure monitoring and/or need for frequent ABGs  Consent Risks of the procedure as well as the alternatives and risks of each were explained to the patient and/or caregiver.  Consent for the procedure was obtained and is signed in the bedside chart  Anesthesia None   Time Out Verified patient identification, verified procedure, site/side was marked, verified correct patient position, special equipment/implants available, medications/allergies/relevant history reviewed, required imaging and test results available.   Sterile Technique Maximal sterile technique including full sterile barrier drape, hand hygiene, sterile gown, sterile gloves, mask, hair covering, sterile ultrasound probe cover (if used).   Procedure Description Area of catheter insertion was cleaned with chlorhexidine and draped in sterile fashion. With real-time ultrasound guidance an arterial catheter was placed into the left femoral artery.  Appropriate arterial tracings confirmed on monitor.     Complications/Tolerance None; patient tolerated the procedure well.   EBL Minimal   Specimen(s) None   BIOPATCH was applied to the insertion site.   Harlon Ditty, AGACNP-BC [facility] Pager: [phone]

## 2020-05-12 NOTE — Consult Note (Signed)
[facility]  CONSULT NOTE    Date: 04-16-20                  Patient Name:  Lydia Avila  MRN: [id number]  DOB: 05-22-1951  Age / Sex: 69 y.o., female         PCP: Orene Desanctis, MD                 Service Requesting Consult: Dr. Belia Heman                 Reason for Consult: Acute renal failure            History of Present Illness: Ms. Lydia Avila admitted for DKA and placed BIPAP and vasopressors. Patient with anuria and nephrology consulted. Patient able to answer yes and no questions.   Patient is    Medications: Outpatient medications: Medications Prior to Admission  Medication Sig Dispense Refill Last Dose  . alendronate (FOSAMAX) 70 MG tablet Take 70 mg by mouth once a week. Take with a full glass of water on an empty stomach.   Past Week at Unknown time  . aspirin 325 MG tablet Take 325 mg by mouth daily.   04/10/2020 at Unknown time  . atenolol (TENORMIN) 50 MG tablet TAKE 1 TABLET BY MOUTH EVERY DAY   04/10/2020 at Unknown time  . calcium carbonate (OS-CAL - DOSED IN MG OF ELEMENTAL CALCIUM) 1250 (500 Ca) MG tablet Take 1 tablet by mouth daily.   04/10/2020 at Unknown time  . Choline Fenofibrate (FENOFIBRIC ACID) 135 MG CPDR Take by mouth. TAKE 1 CAPSULE (135 MG TOTAL) BY MOUTH ONCE DAILY   04/10/2020 at Unknown time  . diphenhydrAMINE (BENADRYL ALLERGY) 25 MG tablet Take by mouth.   Past Week at PRN  . hydrochlorothiazide (HYDRODIURIL) 25 MG tablet Take 25 mg by mouth daily.   04/10/2020 at Unknown time  . lisinopril (ZESTRIL) 10 MG tablet Take 10 mg by mouth daily.   04/10/2020 at Unknown time  . Multiple Vitamins-Minerals (CENTRUM SILVER ULTRA WOMENS) TABS Take 1 tablet by mouth daily.   04/10/2020 at Unknown time  . pravastatin (PRAVACHOL) 40 MG tablet Take 1 tablet (40 mg total) by mouth daily. 30 tablet 1 04/10/2020 at Unknown time  . Vitamin D, Ergocalciferol, (DRISDOL) 50000 units CAPS capsule Take by mouth.   Past Week at Unknown time    Current  medications: Current Facility-Administered Medications  Medication Dose Route Frequency Provider Last Rate Last Admin  . 0.9 %  sodium chloride infusion   Intravenous Continuous Erin Fulling, MD 75 mL/hr at 2020-04-16 0948 New Bag at 04/16/20 0948  . dextrose 5 %-0.45 % sodium chloride infusion   Intravenous Continuous Kasa, Kurian, MD      . dextrose 50 % solution 0-50 mL  0-50 mL Intravenous PRN Erin Fulling, MD      . docusate sodium (COLACE) capsule 100 mg  100 mg Oral BID PRN Eugenie Norrie, NP      . famotidine (PEPCID) IVPB 20 mg premix  20 mg Intravenous Q24H Eugenie Norrie, NP 100 mL/hr at 04-16-20 0950 20 mg at April 16, 2020 0950  . fentaNYL in NS (6mcg/ml) infusion-PREMIX  0-400 mcg/hr Intravenous Continuous Erin Fulling, MD 5 mL/hr at April 16, 2020 1209 50 mcg/hr at 04/16/2020 1209  . heparin injection 5,000 Units  5,000 Units Subcutaneous Q8H Eugenie Norrie, NP      . hydrocortisone sodium succinate (SOLU-CORTEF) 100 MG injection 50 mg  50 mg Intravenous Q6H Erin Fulling, MD   50 mg at May 07, 2020 1215  . insulin regular, human (MYXREDLIN) 100 units/ 100 mL infusion   Intravenous Continuous Erin Fulling, MD 5 mL/hr at 07-May-2020 0942 5 Units/hr at May 07, 2020 0942  . ipratropium-albuterol (DUONEB) 0.5-2.5 (3) MG/3ML nebulizer solution 3 mL  3 mL Nebulization Q6H PRN Eugenie Norrie, NP      . morphine 2 MG/ML injection 1-2 mg  1-2 mg Intravenous Q4H PRN Eugenie Norrie, NP      . norepinephrine (LEVOPHED) 4mg  in premix infusion  0-40 mcg/min Intravenous Titrated , MD 150 mL/hr at May 07, 2020 1207 40 mcg/min at 07-May-2020 1207  . ondansetron (ZOFRAN) injection 4 mg  4 mg Intravenous Q6H PRN 1208, NP      . piperacillin-tazobactam (ZOSYN) IVPB 2.25 g  2.25 g Intravenous Q8H Eugenie Norrie, MD 100 mL/hr at 05-07-20 1212 2.25 g at 2020/05/07 1212  . polyethylene glycol (MIRALAX / GLYCOLAX) packet 17 g  17 g Oral Daily PRN 06/12/20, NP      . sodium bicarbonate  150 mEq in sterile water 1,000 mL infusion   Intravenous Continuous Eugenie Norrie, NP 100 mL/hr at 2020/05/07 0925 New Bag at 2020-05-07 0925      Allergies: No Known Allergies    Past Medical History: Past Medical History:  Diagnosis Date  . Dementia (HCC)   . Hypertension   . TIA (transient ischemic attack)      Past Surgical History: Past Surgical History:  Procedure Laterality Date  . ABDOMINAL HYSTERECTOMY       Family History: Family History  Problem Relation Age of Onset  . Breast cancer Neg Hx      Social History: Social History   Socioeconomic History  . Marital status: Divorced    Spouse name: Not on file  . Number of children: Not on file  . Years of education: Not on file  . Highest education level: Not on file  Occupational History  . Not on file  Tobacco Use  . Smoking status: Current Every Day Smoker    Packs/day: 0.25    Types: Cigarettes  . Smokeless tobacco: Never Used  Vaping Use  . Vaping Use: Never used  Substance and Sexual Activity  . Alcohol use: No  . Drug use: No  . Sexual activity: Not Currently  Other Topics Concern  . Not on file  Social History Narrative  . Not on file   Social Determinants of Health   Financial Resource Strain:   . Difficulty of Paying Living Expenses:   Food Insecurity:   . Worried About 06/12/20 in the Last Year:   . Programme researcher, broadcasting/film/video in the Last Year:   Transportation Needs:   . Barista (Medical):   Freight forwarder Lack of Transportation (Non-Medical):   Physical Activity:   . Days of Exercise per Week:   . Minutes of Exercise per Session:   Stress:   . Feeling of Stress :   Social Connections:   . Frequency of Communication with Friends and Family:   . Frequency of Social Gatherings with Friends and Family:   . Attends Religious Services:   . Active Member of Clubs or Organizations:   . Attends [employer] Meetings:   Banker Marital Status:   Intimate Partner Violence:   .  Fear of Current or Ex-Partner:   . Emotionally Abused:   [employer] Physically Abused:   .  Sexually Abused:      Review of Systems: Review of Systems  Unable to perform ROS: Critical illness    Vital Signs: Blood pressure 126/68, pulse (!) 29, temperature 98.2 F (36.8 C), temperature source Bladder, resp. rate 17, height 5\' 4"  (1.626 m), weight 88.8 kg, SpO2 (!) 66 %.  Weight trends: Filed Weights   04/24/2020 0304 04/24/2020 0701  Weight: 76.2 kg 88.8 kg    Physical Exam: General: Critically ill  Head: +BIPAP  Eyes: Anicteric, PERRL  Neck: Supple, trachea midline  Lungs:  Clear to auscultation  Heart: Regular rate and rhythm  Abdomen:  +epigastric and RUQ tenderness  Extremities:  no peripheral edema.  Neurologic: Nonfocal, moving all four extremities  Skin: No lesions        Lab results: Basic Metabolic Panel: Recent Labs  Lab 24-Apr-2020 0307 April 24, 2020 0545 Apr 24, 2020 0729  NA 133*  --  134*  K 4.4  --  4.9  CL 97*  --  97*  CO2 8*  --  10*  GLUCOSE 528*  --  414*  BUN 38*  --  40*  CREATININE 3.40* 3.28* 3.44*  CALCIUM 7.8*  --  7.1*    Liver Function Tests: Recent Labs  Lab 2020/04/24 0307  AST 887*  ALT 497*  ALKPHOS 98  BILITOT 1.3*  PROT 7.3  ALBUMIN 3.5   Recent Labs  Lab 2020-04-24 0307  LIPASE 3,293*   No results for input(s): AMMONIA in the last 168 hours.  CBC: Recent Labs  Lab 04-24-2020 0307 04-24-20 0545  WBC 11.5* 10.3  NEUTROABS 8.9*  --   HGB 14.4 13.3  HCT 45.9 40.4  MCV 88.8 86.0  PLT 230 156    Cardiac Enzymes: No results for input(s): CKTOTAL, CKMB, CKMBINDEX, TROPONINI in the last 168 hours.  BNP: Invalid input(s): POCBNP  CBG: Recent Labs  Lab 04/24/2020 0758  GLUCAP 399*    Microbiology: Results for orders placed or performed during the hospital encounter of 04/24/20  Blood Culture (routine x 2)     Status: None (Preliminary result)   Collection Time: Apr 24, 2020  3:07 AM   Specimen: BLOOD LEFT HAND  Result Value Ref  Range Status   Specimen Description BLOOD LEFT HAND  Final   Special Requests   Final    BOTTLES DRAWN AEROBIC AND ANAEROBIC Blood Culture results may not be optimal due to an inadequate volume of blood received in culture bottles   Culture   Final    NO GROWTH < 12 HOURS Performed at [facility], [address], [place], [place]    Report Status PENDING  Incomplete  SARS Coronavirus 2 by RT PCR (hospital order, performed in [facility] hospital lab) Nasopharyngeal Nasopharyngeal Swab     Status: None   Collection Time: 2020/04/24  3:11 AM   Specimen: Nasopharyngeal Swab  Result Value Ref Range Status   SARS Coronavirus 2 NEGATIVE NEGATIVE Final    Comment: (NOTE) SARS-CoV-2 target nucleic acids are NOT DETECTED.  The SARS-CoV-2 RNA is generally detectable in upper and lower respiratory specimens during the acute phase of infection. The lowest concentration of SARS-CoV-2 viral copies this assay can detect is 250 copies / mL. A negative result does not preclude SARS-CoV-2 infection and should not be used as the sole basis for treatment or other patient management decisions.  A negative result may occur with improper specimen collection / handling, submission of specimen other than nasopharyngeal swab, presence of viral mutation(s) within the  areas targeted by this assay, and inadequate number of viral copies (<250 copies / mL). A negative result must be combined with clinical observations, patient history, and epidemiological information.  Fact Sheet for Patients:   BoilerBrush.com.cyhttps://www.fda.gov/media/136312/download  Fact Sheet for Healthcare Providers: https://pope.com/https://www.fda.gov/media/136313/download  This test is not yet approved or  cleared by the [place] FDA and has been authorized for detection and/or diagnosis of SARS-CoV-2 by FDA under an Emergency Use Authorization (EUA).  This EUA will remain in effect (meaning this test can be used) for the duration of  the COVID-19 declaration under Section 564(b)(1) of the Act, 21 U.S.C. section 360bbb-3(b)(1), unless the authorization is terminated or revoked sooner.  Performed at [facility], [address] [place], [place] [address]   Blood Culture (routine x 2)     Status: None (Preliminary result)   Collection Time: 18-Oct-2019  3:12 AM   Specimen: BLOOD RIGHT WRIST  Result Value Ref Range Status   Specimen Description BLOOD RIGHT WRIST  Final   Special Requests   Final    BOTTLES DRAWN AEROBIC AND ANAEROBIC Blood Culture adequate volume   Culture   Final    NO GROWTH < 12 HOURS Performed at [facility], [address] [place], [place] [address]    Report Status PENDING  Incomplete    Coagulation Studies: Recent Labs    18-Oct-2019 0307  LABPROT 15.2  INR 1.3*    Urinalysis: Recent Labs    18-Oct-2019 0307  COLORURINE YELLOW*  LABSPEC 1.017  PHURINE 7.0  GLUCOSEU NEGATIVE  HGBUR NEGATIVE  BILIRUBINUR NEGATIVE  KETONESUR NEGATIVE  PROTEINUR NEGATIVE  NITRITE NEGATIVE  LEUKOCYTESUR NEGATIVE      Imaging: CT ABDOMEN PELVIS WO CONTRAST  Result Date: 05/11/2020 CLINICAL DATA:  Respiratory illness with nondiagnostic x-ray. Vomiting. EXAM: CT CHEST, ABDOMEN AND PELVIS WITHOUT CONTRAST TECHNIQUE: Multidetector CT imaging of the chest, abdomen and pelvis was performed following the standard protocol without IV contrast. COMPARISON:  None. FINDINGS: CT CHEST FINDINGS Cardiovascular: Normal heart size. No pericardial effusion. Atherosclerotic calcification along the aorta and coronaries. Mediastinum/Nodes: Patulous esophagus without evidence of inflammation/wall thickening. No adenopathy Lungs/Pleura: Low volume chest with dependent atelectasis. There is no edema, consolidation, effusion, or pneumothorax. Musculoskeletal: Spinal degeneration without acute finding. CT ABDOMEN PELVIS FINDINGS Hepatobiliary: Hepatic steatosis with central sparing.Cholelithiasis without  gallbladder distention or bile duct dilatation. No visible calcified choledocholithiasis. Pancreas: Diffusely enlarged with peripancreatic and generalized retroperitoneal stranding. No gross collection. Spleen: Unremarkable. Adrenals/Urinary Tract: Negative adrenals. No hydronephrosis or stone. A cystic density is present at the upper pole right kidney. The bladder is decompressed by a redundant Foley catheter. Stomach/Bowel: Formed stool throughout much of the colon with rectal distention. No small bowel dilatation. Vascular/Lymphatic: Multifocal atherosclerotic calcification. No mass or adenopathy. Reproductive:Hysterectomy Other: No ascites or pneumoperitoneum. Musculoskeletal: Remote endplate fractures of L1, L3, and L4. A lucency in the L4 body is likely Schmorl's node. Remote left obturator ring fracture. Bone harvest site from the left ilium. IMPRESSION: 1. Acute pancreatitis. 2. Cholelithiasis. 3. Hepatic steatosis. 4. Low volume chest with atelectasis. Electronically Signed   By: Marnee SpringJonathon  Watts M.D.   On: 2020-06-15 05:42   CT Chest Wo Contrast  Result Date: 04/19/2020 CLINICAL DATA:  Respiratory illness with nondiagnostic x-ray. Vomiting. EXAM: CT CHEST, ABDOMEN AND PELVIS WITHOUT CONTRAST TECHNIQUE: Multidetector CT imaging of the chest, abdomen and pelvis was performed following the standard protocol without IV contrast. COMPARISON:  None. FINDINGS: CT CHEST FINDINGS Cardiovascular: Normal heart size. No pericardial  effusion. Atherosclerotic calcification along the aorta and coronaries. Mediastinum/Nodes: Patulous esophagus without evidence of inflammation/wall thickening. No adenopathy Lungs/Pleura: Low volume chest with dependent atelectasis. There is no edema, consolidation, effusion, or pneumothorax. Musculoskeletal: Spinal degeneration without acute finding. CT ABDOMEN PELVIS FINDINGS Hepatobiliary: Hepatic steatosis with central sparing.Cholelithiasis without gallbladder distention or bile duct  dilatation. No visible calcified choledocholithiasis. Pancreas: Diffusely enlarged with peripancreatic and generalized retroperitoneal stranding. No gross collection. Spleen: Unremarkable. Adrenals/Urinary Tract: Negative adrenals. No hydronephrosis or stone. A cystic density is present at the upper pole right kidney. The bladder is decompressed by a redundant Foley catheter. Stomach/Bowel: Formed stool throughout much of the colon with rectal distention. No small bowel dilatation. Vascular/Lymphatic: Multifocal atherosclerotic calcification. No mass or adenopathy. Reproductive:Hysterectomy Other: No ascites or pneumoperitoneum. Musculoskeletal: Remote endplate fractures of L1, L3, and L4. A lucency in the L4 body is likely Schmorl's node. Remote left obturator ring fracture. Bone harvest site from the left ilium. IMPRESSION: 1. Acute pancreatitis. 2. Cholelithiasis. 3. Hepatic steatosis. 4. Low volume chest with atelectasis. Electronically Signed   By: Marnee Spring M.D.   On: 04-24-2020 05:42   DG Chest Port 1 View  Result Date: 2020/04/24 CLINICAL DATA:  Orogastric tube placement. EXAM: PORTABLE CHEST 1 VIEW COMPARISON:  Same day. FINDINGS: Stable cardiomediastinal silhouette. Endotracheal tube tip is seen at the carina; withdrawal by 2-3 cm is recommended. Distal tip of nasogastric tube is seen in distal esophagus; advancement is recommended. Right internal jugular dialysis catheter is noted with tip in right atrium. No pneumothorax is noted. No significant pleural effusion is noted. Left lung is clear. Mild right basilar subsegmental atelectasis is noted. Bony thorax is unremarkable. IMPRESSION: Endotracheal tube tip is seen at the carina; withdrawal by 2-3 cm is recommended. Distal tip of nasogastric tube is seen in distal esophagus; advancement is recommended. Mild right basilar subsegmental atelectasis is noted. Electronically Signed   By: Lupita Raider M.D.   On: 2020-04-24 11:54   DG Chest Port 1  View  Result Date: 04-24-2020 CLINICAL DATA:  69 year old female status post central line placement. EXAM: PORTABLE CHEST 1 VIEW COMPARISON:  Chest x-ray 04/24/20. FINDINGS: There is a right-sided internal jugular central venous catheter with tip terminating in the right atrium. Low lung volumes. Opacities at the right base, likely to reflect areas of subsegmental atelectasis. No definite consolidative airspace disease. No pleural effusions. No pneumothorax. No evidence of pulmonary edema. Heart size is borderline enlarged, likely accentuated by low lung volumes and portable AP technique. Upper mediastinal contours are within normal limits. Orthopedic fixation hardware in the lower cervical spine incompletely imaged. IMPRESSION: 1. Support apparatus, as above. 2. Low lung volumes with probable subsegmental atelectasis in the right lower lobe. Electronically Signed   By: Trudie Reed M.D.   On: April 24, 2020 10:11   DG Chest Port 1 View  Result Date: 2020/04/24 CLINICAL DATA:  Vomiting, diaphoresis, short of breath, sepsis EXAM: PORTABLE CHEST 1 VIEW COMPARISON:  09/27/2018 FINDINGS: Single frontal view of the chest demonstrates an unremarkable cardiac silhouette. There is mild central vascular congestion. Patchy consolidation is seen in the medial right lung base. No effusion or pneumothorax. No acute bony abnormalities. IMPRESSION: 1. Patchy right basilar consolidation which could reflect airspace disease or atelectasis. 2. Mild central vascular congestion. Electronically Signed   By: Sharlet Salina M.D.   On: 2020-04-24 03:41      Assessment & Plan: Ms. Lynanne Delgreco is a 69 y.o. black female with diabetes mellitus type II, hypertension, dementia,  hyperlipidemia, CVA, osteoporosis who was admitted to [facility] on 08-May-2020 for Metabolic acidosis [E87.2] Elevated LFTs [R79.89] Septic shock (HCC) [A41.9, R65.21] Sepsis (HCC) [A41.9] Acute renal failure, unspecified acute renal failure type (HCC)  [N17.9] Acute pancreatitis, unspecified complication status, unspecified pancreatitis type [K85.90]  1. Acute renal failure on chronic kidney disease stage IIIA: history of bland urine. Baseline creatinine of 0.99, GFR of 58 on 12/31/19.  Chronic kidney disease secondary to diabetic nephropathy Acute renal failure secondary to acute pancreatitis and diabetic ketoacidosis.   2. Diabetic ketoacidosis with anion gap of 27. Placed on DKA protocol.   3. Hypotension: home regimen of atenolol, lisinopril and hydrochlorothiazide. Holding medications currently.   Plan Start CRRT: CVVHD 2K bath with no ultrafiltration   LOS: 0 Valeen Borys 2021-04-2811:27 PM

## 2020-05-12 NOTE — Progress Notes (Signed)
   2020/04/17 2000  Clinical Encounter Type  Visited With Family  Visit Type Initial;Spiritual support  Referral From Nurse  Consult/Referral To Chaplain;Nurse  Spiritual Encounters  Spiritual Needs Emotional  Stress Factors  Family Stress Factors Health changes  Chaplain sat with daughter until sister of patient arrived. Chaplain gave active listening while daughter shared her thoughts about her mother illness and offer empathy.

## 2020-05-12 NOTE — Progress Notes (Signed)
*  PRELIMINARY RESULTS* Echocardiogram 2D Echocardiogram has been performed.  Lydia Avila 05/03/2020, 2:24 PM 

## 2020-05-12 NOTE — Progress Notes (Addendum)
PHARMACY CONSULT NOTE - FOLLOW UP  Pharmacy Consult for Electrolyte Monitoring and Replacement   Recent Labs: Potassium (mmol/L)  Date Value  2020/05/03 5.1   Calcium (mg/dL)  Date Value  62/26/3335 6.0 (LL)   Albumin (g/dL)  Date Value  45/62/5638 3.5   Sodium (mmol/L)  Date Value  05-03-20 137     Assessment: 69 year old female admitted with DKA, acute pancreatitis and AKI. Patient required intubation 7/1 and is currently sedated with fentanyl. Patient with significant vasopressor requirements, on Levophed and vasopressin. Now on stress dose steroids. Insulin drip for DKA as well as sodium bicarb drip for acidosis. Patient to start on CRRT for acute renal failure. Pharmacy consulted to manage electrolytes.  Goal of Therapy:  Electrolytes WNL, K > 4 while on insulin and bicarb drips  Plan:  Potassium continues to increase despite both insulin and bicarb drips likely in setting of worsening renal function. Patient now to start on CRRT with 2K bath. Will monitor closely as expect potassium to start dropping.  Corrected calcium only 6.8 (has decreased significantly), will start with calcium 2 g IV for now.   BMP q4h while on insulin drip. Will continue to follow along.  Pricilla Riffle ,PharmD Clinical Pharmacist 2020/05/03 1:16 PM

## 2020-05-12 NOTE — ED Notes (Signed)
Daughter at bedside. Provider stated no rectal temperature is needed, as a temp foley was placed.

## 2020-05-12 NOTE — Progress Notes (Signed)
Inpatient Diabetes Program Recommendations  AACE/ADA: New Consensus Statement on Inpatient Glycemic Control (2015)  Target Ranges:  Prepandial:   less than 140 mg/dL      Peak postprandial:   less than 180 mg/dL (1-2 hours)      Critically ill patients:  140 - 180 mg/dL   Lab Results  Component Value Date   GLUCAP 399 (H) Apr 28, 2020    Review of Glycemic Control  Diabetes history: No prior hx noted Current orders for Inpatient glycemic control: IV insulin per Endotool starting  Inpatient Diabetes Program Recommendations:   Agree with plan for insulin drip per Endotool and will follow for transition and followup needs.  Thank you, Lydia Avila. Earlyne Feeser, RN, MSN, CDE  Diabetes Coordinator Inpatient Glycemic Control Team Team Pager [phone] (8am-5pm) 28-Apr-2020 9:40 AM

## 2020-05-12 NOTE — ED Notes (Signed)
Provider notified of hypotension

## 2020-05-12 NOTE — Procedures (Addendum)
Central Venous Catheter Insertion Procedure Note  Lydia Avila  [id number]  September 24, 1951  Date:04-30-2020  Time:8:40 PM   Provider Performing:Austan Nicholl D Elvina Sidle   Procedure: Insertion of Non-tunneled Central Venous [id number]) with US guidance (48185)   Indication(s) Medication administration and Difficult access  Consent Risks of the procedure as well as the alternatives and risks of each were explained to the patient and/or caregiver.  Consent for the procedure was obtained and is signed in the bedside chart  Anesthesia Pt receiving fentanyl IV infusion as she is currently intubated  Timeout Verified patient identification, verified procedure, site/side was marked, verified correct patient position, special equipment/implants available, medications/allergies/relevant history reviewed, required imaging and test results available.  Sterile Technique Maximal sterile technique including full sterile barrier drape, hand hygiene, sterile gown, sterile gloves, mask, hair covering, sterile ultrasound probe cover (if used).  Procedure Description Area of catheter insertion was cleaned with chlorhexidine and draped in sterile fashion.  With real-time ultrasound guidance a central venous catheter was placed into the left femoral vein. Nonpulsatile blood flow and easy flushing noted in all ports.  The catheter was sutured in place and sterile dressing applied.  Complications/Tolerance None; patient tolerated the procedure well. Chest X-ray is ordered to verify placement for internal jugular or subclavian cannulation.   Chest x-ray is not ordered for femoral cannulation.  EBL Minimal  Specimen(s) None   Line was secured at the 20 cm mark.  BIOPATCH was applied to the insertion site.     Harlon Ditty, AGACNP-BC [facility] Pager: [phone]

## 2020-05-12 NOTE — Procedures (Signed)
Central Venous Dailysis Catheter Placement: TRIPLE LUMEN  Indication: Hemo Dialysis/CRRT   Consent:emergent   Hand washing performed prior to starting the procedure.   Procedure: An active timeout was performed and correct patient, name, & ID confirmed. Patient was positioned correctly for central venous access. Patient was prepped using strict sterile technique including chlorohexadine preps, sterile drape, sterile gown and sterile gloves.  The area was prepped, draped and anesthetized in the usual sterile manner. Patient comfort was obtained.    A Double lumen catheter was placed in RT IJ  Vein There was good blood return, catheter caps were placed on lumens, catheter flushed easily, the line was secured and a sterile dressing and BIO-PATCH applied.   Ultrasound was used to visualize vasculature and guidance of needle.   Number of Attempts: 1 Complications:none  Estimated Blood Loss: none Operator: Oden Lindaman.   Marilyn Wing David Terrius Gentile, M.D.  [facility]  Medical Director ICU-[facility] [place] Medical Director [facility] Cardio-P[facility] Department     

## 2020-05-12 NOTE — Progress Notes (Signed)
CODE SEPSIS - PHARMACY COMMUNICATION  **Broad Spectrum Antibiotics should be administered within 1 hour of Sepsis diagnosis**  Time Code Sepsis Called/Page Received: 0310  Antibiotics Ordered: vanc/cefepime/flagyl  Time of 1st antibiotic administration: 0325  Additional action taken by pharmacy:   If necessary, Name of Provider/Nurse Contacted:     Thomasene Ripple ,PharmD Clinical Pharmacist  04/15/20  4:13 AM

## 2020-05-12 NOTE — ED Provider Notes (Signed)
[facility] Emergency Department Provider Note  ____________________________________________   First MD Initiated Contact with Patient 04-30-20 [phone]     (approximate)  I have reviewed the triage vital signs and the nursing notes.   HISTORY  Chief Complaint Code Sepsis  Level 5 caveat:  history/ROS limited by acute/critical illness  HPI Lydia Avila is a 69 y.o. female he reportedly has at least mild dementia but otherwise takes relatively few medications and lives with her daughter.  She presents by EMS tonight for several symptoms.  Reportedly she developed vomiting yesterday and vomited for most of the day.  This continued today and then she developed what was described as shortness of breath by the evening.  Firefighters put her on a nonrebreather on the scene due to her increased work of breathing.  She still feels short of breath upon arrival to the ED.  Of note she also had an axillary temperature of 96 degrees and feels extremely cool to the touch.  She is currently reporting no pain and she is alert and oriented but obviously critically ill.  She only complains of shortness of breath, feeling cold even though the house was quite hot, and she denies any pain.         Past Medical History:  Diagnosis Date  . Dementia (HCC)   . Hypertension   . TIA (transient ischemic attack)     Patient Active Problem List   Diagnosis Date Noted  . Sepsis (HCC) 30-Apr-2020  . CVA (cerebral vascular accident) (HCC) 09/27/2018    Past Surgical History:  Procedure Laterality Date  . ABDOMINAL HYSTERECTOMY      Prior to Admission medications   Medication Sig Start Date End Date Taking? Authorizing Provider  alendronate (FOSAMAX) 70 MG tablet Take 70 mg by mouth once a week. Take with a full glass of water on an empty stomach.   Yes [provider]  aspirin 325 MG tablet Take 325 mg by mouth daily.   Yes [provider]  atenolol (TENORMIN) 50  MG tablet TAKE 1 TABLET BY MOUTH EVERY DAY 02/23/16  Yes [provider]  calcium carbonate (OS-CAL - DOSED IN MG OF ELEMENTAL CALCIUM) 1250 (500 Ca) MG tablet Take 1 tablet by mouth daily.   Yes [provider]  Choline Fenofibrate (FENOFIBRIC ACID) 135 MG CPDR Take by mouth. TAKE 1 CAPSULE (135 MG TOTAL) BY MOUTH ONCE DAILY   Yes [provider]  diphenhydrAMINE (BENADRYL ALLERGY) 25 MG tablet Take by mouth.   Yes [provider]  hydrochlorothiazide (HYDRODIURIL) 25 MG tablet Take 25 mg by mouth daily.   Yes [provider]  lisinopril (ZESTRIL) 10 MG tablet Take 10 mg by mouth daily.   Yes [provider]  Multiple Vitamins-Minerals (CENTRUM SILVER ULTRA WOMENS) TABS Take 1 tablet by mouth daily.   Yes [provider]  pravastatin (PRAVACHOL) 40 MG tablet Take 1 tablet (40 mg total) by mouth daily. 09/28/18 Apr 30, 2020 Yes Houston Siren, MD  Vitamin D, Ergocalciferol, (DRISDOL) 50000 units CAPS capsule Take by mouth. 06/23/17  Yes [provider]    Allergies Patient has no known allergies.  Family History  Problem Relation Age of Onset  . Breast cancer Neg Hx     Social History Social History   Tobacco Use  . Smoking status: Current Every Day Smoker    Packs/day: 0.25    Types: Cigarettes  . Smokeless tobacco: Never Used  Vaping Use  . Vaping  Use: Never used  Substance Use Topics  . Alcohol use: No  . Drug use: No    Review of Systems Level 5 caveat:  history/ROS limited by acute/critical illness  ____________________________________________   PHYSICAL EXAM:  VITAL SIGNS: ED Triage Vitals  Enc Vitals Group     BP 05/07/20 0307 (!) 121/91     Pulse Rate 05-07-2020 0307 (!) 111     Resp May 07, 2020 0307 16     Temp 05/07/2020 0329 98.5 F (36.9 C)     Temp Source May 07, 2020 0329 Bladder     SpO2 May 07, 2020 0330 96 %     Weight 05/07/2020 0304 76.2 kg (168 lb)     Height 2020/05/07 0304 1.626 m ( )      Head Circumference --      Peak Flow --      Pain Score 05/07/20 0304 0     Pain Loc --      Pain Edu? --      Excl. in GC? --     Constitutional: Alert and oriented.  Chronically ill-appearing. Eyes: Conjunctivae are normal.  Head: Atraumatic. Nose: No congestion/rhinnorhea. Mouth/Throat: Dry mucous membranes. Neck: No stridor.  No meningeal signs.   Cardiovascular: Tachycardia, regular rhythm. Good peripheral circulation. Grossly normal heart sounds. Respiratory: Increased respiratory effort with deep breaths most consistent with cues more respirations.  Clear breath sounds but with tachypnea. Gastrointestinal: Obese.  Soft and nondistended.  Diffuse tenderness to palpation throughout the abdomen without any specific focal tenderness but not consistent with generalized peritonitis.  No guarding and no rebound. Musculoskeletal: No lower extremity tenderness nor edema. No gross deformities of extremities. Neurologic:  Normal speech and language. No gross focal neurologic deficits are appreciated.  Skin:  Skin is warm, dry and intact.   ____________________________________________   LABS (all labs ordered are listed, but only abnormal results are displayed)  Labs Reviewed  LACTIC ACID, PLASMA - Abnormal; Notable for the following components:      Result Value   Lactic Acid, Venous >11.0 (*)    All other components within normal limits  LACTIC ACID, PLASMA - Abnormal; Notable for the following components:   Lactic Acid, Venous >11.0 (*)    All other components within normal limits  COMPREHENSIVE METABOLIC PANEL - Abnormal; Notable for the following components:   Sodium 133 (*)    Chloride 97 (*)    CO2 8 (*)    Glucose, Bld 528 (*)    BUN 38 (*)    Creatinine, Ser 3.40 (*)    Calcium 7.8 (*)    AST 887 (*)    ALT 497 (*)    Total Bilirubin 1.3 (*)    GFR calc non Af Amer 13 (*)    GFR calc Af Amer 15 (*)    Anion gap 28 (*)    All other components within normal limits    LIPASE, BLOOD - Abnormal; Notable for the following components:   Lipase 3,293 (*)    All other components within normal limits  CBC WITH DIFFERENTIAL/PLATELET - Abnormal; Notable for the following components:   WBC 11.5 (*)    RBC 5.17 (*)    RDW 16.5 (*)    Neutro Abs 8.9 (*)    Abs Immature Granulocytes 0.10 (*)    All other components within normal limits  PROTIME-INR - Abnormal; Notable for the following components:   INR 1.3 (*)    All other components within normal limits  URINALYSIS, COMPLETE (UACMP)  WITH MICROSCOPIC - Abnormal; Notable for the following components:   Color, Urine YELLOW (*)    APPearance CLEAR (*)    All other components within normal limits  BLOOD GAS, ARTERIAL - Abnormal; Notable for the following components:   pH, Arterial 6.92 (*)    pCO2 arterial 25 (*)    pO2, Arterial 112 (*)    Bicarbonate 5.1 (*)    Acid-base deficit 26.6 (*)    All other components within normal limits  BRAIN NATRIURETIC PEPTIDE - Abnormal; Notable for the following components:   B Natriuretic Peptide 229.7 (*)    All other components within normal limits  CBC - Abnormal; Notable for the following components:   RDW 16.4 (*)    All other components within normal limits  CREATININE, SERUM - Abnormal; Notable for the following components:   Creatinine, Ser 3.28 (*)    GFR calc non Af Amer 14 (*)    GFR calc Af Amer 16 (*)    All other components within normal limits  BETA-HYDROXYBUTYRIC ACID - Abnormal; Notable for the following components:   Beta-Hydroxybutyric Acid 0.30 (*)    All other components within normal limits  LIPID PANEL - Abnormal; Notable for the following components:   HDL 34 (*)    All other components within normal limits  SARS CORONAVIRUS 2 BY RT PCR (HOSPITAL ORDER, PERFORMED IN [facility])  CULTURE, BLOOD (ROUTINE X 2)  CULTURE, BLOOD (ROUTINE X 2)  URINE CULTURE  PROCALCITONIN  BETA-HYDROXYBUTYRIC ACID  HEMOGLOBIN A1C  BASIC  METABOLIC PANEL  BASIC METABOLIC PANEL  BASIC METABOLIC PANEL  BASIC METABOLIC PANEL  BASIC METABOLIC PANEL  BETA-HYDROXYBUTYRIC ACID  BETA-HYDROXYBUTYRIC ACID  HEMOGLOBIN A1C  HIV ANTIBODY (ROUTINE TESTING W REFLEX)  TROPONIN I (HIGH SENSITIVITY)   ____________________________________________  EKG  ED ECG REPORT I, Loleta Rose, the attending physician, personally viewed and interpreted this ECG.  Date: 11-May-2020 EKG Time: 4:35 AM Rate: 100 Rhythm: Sinus tachycardia QRS Axis: normal Intervals: LVH ST/T Wave abnormalities: normal Narrative Interpretation: no evidence of acute ischemia  ____________________________________________  RADIOLOGY I, Loleta Rose, personally viewed and evaluated these images (plain radiographs) as part of my medical decision making, as well as reviewing the written report by the radiologist.  ED MD interpretation: Patchy right basilar consolidation which could reflect airway disease or atelectasis with some mild central vascular congestion but I suspect that the body habitus affects the quality of the image.  CT scans of the chest, abdomen, and pelvis (without contrast) reveal acute pancreatitis and cholelithiasis.  No acute abnormalities identified on chest CT  Official radiology report(s): CT ABDOMEN PELVIS WO CONTRAST  Result Date: 2020-05-11 CLINICAL DATA:  Respiratory illness with nondiagnostic x-ray. Vomiting. EXAM: CT CHEST, ABDOMEN AND PELVIS WITHOUT CONTRAST TECHNIQUE: Multidetector CT imaging of the chest, abdomen and pelvis was performed following the standard protocol without IV contrast. COMPARISON:  None. FINDINGS: CT CHEST FINDINGS Cardiovascular: Normal heart size. No pericardial effusion. Atherosclerotic calcification along the aorta and coronaries. Mediastinum/Nodes: Patulous esophagus without evidence of inflammation/wall thickening. No adenopathy Lungs/Pleura: Low volume chest with dependent atelectasis. There is no edema,  consolidation, effusion, or pneumothorax. Musculoskeletal: Spinal degeneration without acute finding. CT ABDOMEN PELVIS FINDINGS Hepatobiliary: Hepatic steatosis with central sparing.Cholelithiasis without gallbladder distention or bile duct dilatation. No visible calcified choledocholithiasis. Pancreas: Diffusely enlarged with peripancreatic and generalized retroperitoneal stranding. No gross collection. Spleen: Unremarkable. Adrenals/Urinary Tract: Negative adrenals. No hydronephrosis or stone. A cystic density is present at the upper pole right  kidney. The bladder is decompressed by a redundant Foley catheter. Stomach/Bowel: Formed stool throughout much of the colon with rectal distention. No small bowel dilatation. Vascular/Lymphatic: Multifocal atherosclerotic calcification. No mass or adenopathy. Reproductive:Hysterectomy Other: No ascites or pneumoperitoneum. Musculoskeletal: Remote endplate fractures of L1, L3, and L4. A lucency in the L4 body is likely Schmorl's node. Remote left obturator ring fracture. Bone harvest site from the left ilium. IMPRESSION: 1. Acute pancreatitis. 2. Cholelithiasis. 3. Hepatic steatosis. 4. Low volume chest with atelectasis. Electronically Signed   By: Marnee Spring M.D.   On: April 19, 2020 05:42   CT Chest Wo Contrast  Result Date: April 19, 2020 CLINICAL DATA:  Respiratory illness with nondiagnostic x-ray. Vomiting. EXAM: CT CHEST, ABDOMEN AND PELVIS WITHOUT CONTRAST TECHNIQUE: Multidetector CT imaging of the chest, abdomen and pelvis was performed following the standard protocol without IV contrast. COMPARISON:  None. FINDINGS: CT CHEST FINDINGS Cardiovascular: Normal heart size. No pericardial effusion. Atherosclerotic calcification along the aorta and coronaries. Mediastinum/Nodes: Patulous esophagus without evidence of inflammation/wall thickening. No adenopathy Lungs/Pleura: Low volume chest with dependent atelectasis. There is no edema, consolidation, effusion, or  pneumothorax. Musculoskeletal: Spinal degeneration without acute finding. CT ABDOMEN PELVIS FINDINGS Hepatobiliary: Hepatic steatosis with central sparing.Cholelithiasis without gallbladder distention or bile duct dilatation. No visible calcified choledocholithiasis. Pancreas: Diffusely enlarged with peripancreatic and generalized retroperitoneal stranding. No gross collection. Spleen: Unremarkable. Adrenals/Urinary Tract: Negative adrenals. No hydronephrosis or stone. A cystic density is present at the upper pole right kidney. The bladder is decompressed by a redundant Foley catheter. Stomach/Bowel: Formed stool throughout much of the colon with rectal distention. No small bowel dilatation. Vascular/Lymphatic: Multifocal atherosclerotic calcification. No mass or adenopathy. Reproductive:Hysterectomy Other: No ascites or pneumoperitoneum. Musculoskeletal: Remote endplate fractures of L1, L3, and L4. A lucency in the L4 body is likely Schmorl's node. Remote left obturator ring fracture. Bone harvest site from the left ilium. IMPRESSION: 1. Acute pancreatitis. 2. Cholelithiasis. 3. Hepatic steatosis. 4. Low volume chest with atelectasis. Electronically Signed   By: Marnee Spring M.D.   On: 04-19-20 05:42   DG Chest Port 1 View  Result Date: April 19, 2020 CLINICAL DATA:  Vomiting, diaphoresis, short of breath, sepsis EXAM: PORTABLE CHEST 1 VIEW COMPARISON:  09/27/2018 FINDINGS: Single frontal view of the chest demonstrates an unremarkable cardiac silhouette. There is mild central vascular congestion. Patchy consolidation is seen in the medial right lung base. No effusion or pneumothorax. No acute bony abnormalities. IMPRESSION: 1. Patchy right basilar consolidation which could reflect airspace disease or atelectasis. 2. Mild central vascular congestion. Electronically Signed   By: Sharlet Salina M.D.   On: 04-19-2020 03:41    ____________________________________________   PROCEDURES   Procedure(s)  performed (including Critical Care):  .1-3 Lead EKG Interpretation Performed by: Loleta Rose, MD Authorized by: Loleta Rose, MD     Interpretation: abnormal     ECG rate:  115   ECG rate assessment: tachycardic     Rhythm: sinus tachycardia     Ectopy: none     Conduction: normal   .Critical Care Performed by: Loleta Rose, MD Authorized by: Loleta Rose, MD   Critical care provider statement:    Critical care time (minutes):  75   Critical care time was exclusive of:  Separately billable procedures and treating other patients   Critical care was necessary to treat or prevent imminent or life-threatening deterioration of the following conditions:  Sepsis, shock, circulatory failure and metabolic crisis   Critical care was time spent personally by me on the following activities:  Development of treatment plan with patient or surrogate, discussions with consultants, evaluation of patient's response to treatment, examination of patient, obtaining history from patient or surrogate, ordering and performing treatments and interventions, ordering and review of laboratory studies, ordering and review of radiographic studies, pulse oximetry, re-evaluation of patient's condition and review of old charts     ____________________________________________   INITIAL IMPRESSION / MDM / ASSESSMENT AND PLAN / ED COURSE  As part of my medical decision making, I reviewed the following data within the electronic MEDICAL RECORD NUMBER History obtained from family, Nursing notes reviewed and incorporated, Labs reviewed , EKG interpreted , Old chart reviewed, Radiograph reviewed , Discussed with admitting physician , Discussed with intensivist and Notes from prior ED visits   Differential diagnosis includes, but is not limited to, sepsis anyone of a number of sources including urinary tract infection or pneumonia, intra-abdominal perforation, mesenteric ischemia, pancreatitis, biliary obstruction,  metabolic or electrolyte abnormality, DKA.  The patient is on the cardiac monitor to evaluate for evidence of arrhythmia and/or significant heart rate changes.  It is clear this patient is critically ill and I am initiating a septic work-up.  Although her initial blood pressure is normal I anticipate that she will drop and I am giving a full 30 mL/kg of lactated Ringer's.  I am initiating antibiotic therapy with the unknown source protocol which includes cefepime 2 g IV, vancomycin 1 g IV and dosed by pharmacy, and metronidazole 500 mg IV.    Clinical Course as of Apr 11 709  Thu 04/17/2020  1610 Patient is profoundly acidotic with a pH of 6.92.  I am concerned that she may be diabetic and in DKA.  I will hold off on aggressive treatment with bicarb until I have a little bit more information including her electrolytes for fear of causing even more profound hypokalemia which I suspect we will find.  She is maintaining her airway and is working to compensate for her metabolic acidosis so I will try to avoid aggressive airway intervention as it will be difficult to manage her ventilation rate mechanically.  Blood gas, arterial(!!) [CF]  [id number] Patient has dropped her blood pressure to 89 systolic, continuing with peripheral IV fluids.   [CF]  0350 Possible infection, difficult to interpret.  Proceeding with broad spectrum empiric antibiotics  DG Chest Port 1 View [CF]  0402 Lactic Acid, Venous(!!): >11.0 [CF]  0409 Clean UA  Urinalysis, Complete w Microscopic(!) [CF]  0409 INR(!): 1.3 [CF]  0432 SARS Coronavirus 2: NEGATIVE [CF]  0432 The initial CMP sample clotted so an additional IV was established and blood has been sent.  Blood glucose is reportedly greater than 500 but I do not have electrolyte results yet.   [CF]  [id number] I received verbal report from the lab that the patient's creatinine is greater than 3 and her potassium is normal at about 4.4.  Her anion gap is 28.  I have asked them to  release the rest of the results so that I can act upon them.  Her beta hydroxybutyric acid is at the upper limit of normal.  She is still awake and alert and responsive although she remains critically ill and having what is most consistent with Kussmaul breathing.  She has some generalized tenderness to palpation of the abdomen.  Although she is borderline hypotensive, I think it is appropriate at this point to send her to the CT scanner for a noncontrast CT chest/abdomen/pelvis to look for signs of  infection including the possibility of abdominal viscus perforation.  After she gets back I have already verbally/emergently consented the patient and her daughter to place a central line which I believe we will need for additional IV access and medication administration including the possibility of insulin drip and pressors.   [CF]  9735 I discussed the case with the [facility] ICU doctor on-call (I believe it was Dr. Warrick Parisian).  He agreed with the management thus far.  He also agreed with giving her an ampule of sodium bicarb followed by an infusion of 75.  I will place those orders.  He agreed with getting the CT scan results back and getting her admitted to the unit as fast as possible.   [CF]  0530 I spoke with Onalee Hua and pharmacy and verify the appropriate order for the sodium bicarb infusion which is 100 mEq in sterile water given her hyperglycemia.  I have placed the order.   [CF]  0540 Discussed case by phone with Sonda Rumble (NP) in the ICU.  We discussed the case in detail and she will admit.   [CF]  0541 Procalcitonin: 4.84 [CF]  0547 I reassessed the patient.  She remains awake and alert, answering questions, actually states that she feels better.  Still appears critically ill.  Patient and daughter understand and agree with the plan for ICU admission.   [CF]  0547 Comprehensive metabolic panel finally released and is notable for a CO2 of 8, glucose of 528, creatinine of 3.4, AST and ALT substantially  elevated but with an essentially normal bilirubin, and an anion gap of 28.  Of further note is the lipase of greater than 3000.  I suspect this patient is suffering from acute pancreatitis, unclear whether this is from a biliary obstruction.  CT scan still pending.   [CF]  0601 ICU NP Salem Caster is in the room evaluating the patient and we discussed the case in person together.   [CF]  0610 I discussed the case further with Sonda Rumble in person.  She will make a room in the ICU and try to get the patient transferred upstairs ASAP.  She will place the central line in the unit.  After the patient has stabilized, they will send her for an MRCP.   [CF]    Clinical Course User Index [CF] Loleta Rose, MD     ____________________________________________  FINAL CLINICAL IMPRESSION(S) / ED DIAGNOSES  Final diagnoses:  Septic shock (HCC)  Metabolic acidosis  Acute renal failure, unspecified acute renal failure type (HCC)  Acute pancreatitis, unspecified complication status, unspecified pancreatitis type  Elevated LFTs     MEDICATIONS GIVEN DURING THIS VISIT:  Medications  vancomycin (VANCOREADY) IVPB 1500 mg/300 mL (1,500 mg Intravenous New Bag/Given 2020/04/28 0541)  sodium bicarbonate 150 mEq in sterile water 1,000 mL infusion (has no administration in time range)  docusate sodium (COLACE) capsule 100 mg (has no administration in time range)  polyethylene glycol (MIRALAX / GLYCOLAX) packet 17 g (has no administration in time range)  ipratropium-albuterol (DUONEB) 0.5-2.5 (3) MG/3ML nebulizer solution 3 mL (has no administration in time range)  ondansetron (ZOFRAN) injection 4 mg (has no administration in time range)  morphine 2 MG/ML injection 1-2 mg (has no administration in time range)  heparin injection 5,000 Units (has no administration in time range)  sodium chloride 0.9 % bolus 1,000 mL (has no administration in time range)  lactated ringers bolus 1,000 mL (0 mLs Intravenous Stopped  Apr 28, 2020 0434)    And  lactated ringers bolus 1,000 mL (0 mLs Intravenous Stopped 05/09/2020 0538)    And  lactated ringers bolus 500 mL (0 mLs Intravenous Stopped 05/01/2020 0528)  ceFEPIme (MAXIPIME) 2 g in sodium chloride 0.9 % 100 mL IVPB (0 g Intravenous Stopped 05/09/2020 0359)  metroNIDAZOLE (FLAGYL) IVPB 500 mg (0 mg Intravenous Stopped 05/10/2020 0528)  sodium bicarbonate injection 50 mEq (50 mEq Intravenous Given 05/03/2020 0553)     ED Discharge Orders    None      *Please note:  Celestia KhatMary Culpepper was evaluated in Emergency Department on 05/08/2020 for the symptoms described in the history of present illness. She was evaluated in the context of the global COVID-19 pandemic, which necessitated consideration that the patient might be at risk for infection with the SARS-CoV-2 virus that causes COVID-19. Institutional protocols and algorithms that pertain to the evaluation of patients at risk for COVID-19 are in a state of rapid change based on information released by regulatory bodies including the CDC and federal and state organizations. These policies and algorithms were followed during the patient's care in the ED.  Some ED evaluations and interventions may be delayed as a result of limited staffing during and after the pandemic.*  Note:  This document was prepared using Dragon voice recognition software and may include unintentional dictation errors.   Loleta RoseForbach, Jeselle Hiser, MD 07-16-2020 [id number]

## 2020-05-12 NOTE — Progress Notes (Signed)
Notified provider of need to order repeat lactic acid (3rd, d/t first 2 LA being >11). Order received.

## 2020-05-12 NOTE — Progress Notes (Signed)
Brief Pharmacy Consult Note  Patient to start CRRT. Pharmacy consulted to review current medications and make necessary adjustments as needed for CRRT. Review of medications reveals below necessary changes:  Zosyn 2.25 g >> 3.375 g IV q6h infused over 30 min   Pharmacy will continue to monitor and adjust medications as indicated.  Laureen Ochs, PharmD

## 2020-05-12 NOTE — Progress Notes (Signed)
Pt's daughter and pt's sister Britta Mccreedy at bedside, we again discussed pt's critical illness and guarded prognosis.    They would like to continue all aggressive medical interventions overnight and see what happens.  If pt declines further overnight or no noted improvement in the morning, they will likely transition to comfort care measures and withdraw care.    Harlon Ditty, AGACNP-BC [facility] Pager: [phone]

## 2020-05-12 NOTE — ED Notes (Signed)
As per Annabelle Harman, NP from the ICU we can hold off of doing the sodium bicarb infusion and insulin drip till vancomycin is infusing. Provider notified that we have a 20G to the right wrist and a 20G in the left hand. However, the 20G in left hand, RN is hesitant to use, since another IV infused just above it. NP agreed and stated pt will get to ICU and they will get a central line placed.

## 2020-05-12 NOTE — ED Notes (Signed)
Date and time results received: 04/13/20 0351 (use smartphrase ".now" to insert current time)  Test: lactic acid greater than 11 Critical Value: lactic acid great than 11  Name of Provider Notified: York Cerise  Orders Received? Or Actions Taken?: Actions Taken: provider notified

## 2020-05-12 NOTE — Death Summary Note (Signed)
DEATH SUMMARY   Patient Details  Name: Lydia Avila MRN: [id number] DOB: 04-19-1951  Admission/Discharge Information   Admit Date:  2020/04/24  Date of Death:  04-24-2020  Time of Death:  23:00  Length of Stay: 0  Referring Physician: Orene Desanctis, MD   Reason(s) for Hospitalization  Septic shock Hypovolemic shock Acute Pancreatitis DKA Acute Kidney Injury Metabolic Acidosis Acute Hypoxic Respiratory Failure Hyponatremia  Diagnoses  Preliminary cause of death:   Septic Shock Secondary Diagnoses (including complications and co-morbidities):  Active Problems:   Sepsis (HCC)   Acute pancreatitis   Acute renal failure (HCC)   Metabolic acidosis Hypovolemic shock DKA Acute  Hypoxic Respiratory Failure Hyponatremia  Brief Hospital Course (including significant findings, care, treatment, and services provided and events leading to death)  Lydia Avila is a 69 y.o. year old female with a PMH of Dementia, HTN, Anxiety, Depression, Osteoporosis, Acute CVA, and TIA.  She presented to [facility] ER on 2023-04-25 via EMS with c/o nausea/vomiting and shortness of breath, onset of symptoms 06/30.  Per ER notes EMS reported upon their arrival at pts home her CBG reading was 526 and axillary temp 96.1 F.  In the ER vital signs were: temp 98.5 F, hr 112, rr 31, and sbp 77-80's. Sepsis protocol initiated and pt received 2.5L LR bolus with sbp increasing to the 90's.  Lab results revealed Na+ 133, chloride 97, CO2 8, glucose 528, BUN 38, creatinine 3.40, calcium 7.8, anion gap 28, lipase 3,293, AST 887, ALT 497, total bilirubin 1.3, lactic acid >11.0, pct 4.84, wbc 11.5, COVID-19 negative, UA negative, and abg pH 6.92/pCO2 25/pO2 112/acid-base deficit 26.6/bicarb 5.1.  CT Abd/Pelvis/Chest revealed acute pancreatitis, cholelithiasis, hepatic steatosis, and low volume chest atelectasis.  Pt received flagyl, vancomycin, cefepime, 1 amp of sodium bicarb, sodium bicarb gtt along with insulin gtt ordered.  PCCM team  contacted by ER physician for ICU admission .  GI and Nephrology were consulted.  Shortly after admission to ICU she decompensated further, requiring intubation, along with placement of Temporary Dialysis Catheter for initiation of CRRT. She remained Hypotensive and required 3 vasopressors and multiple pushes of Bicarb for persistent metabolic acidosis. Central line and arterial line were placed.  Her poor prognosis was discussed with family,and they decided to make her DNR/DNI.  Despite maximum aggressive medical treatment, she expired later in the evening on 04-24-2020 at 23:00.    Pertinent Labs and Studies  Significant Diagnostic Studies CT ABDOMEN PELVIS WO CONTRAST  Result Date: Apr 24, 2020 CLINICAL DATA:  Respiratory illness with nondiagnostic x-ray. Vomiting. EXAM: CT CHEST, ABDOMEN AND PELVIS WITHOUT CONTRAST TECHNIQUE: Multidetector CT imaging of the chest, abdomen and pelvis was performed following the standard protocol without IV contrast. COMPARISON:  None. FINDINGS: CT CHEST FINDINGS Cardiovascular: Normal heart size. No pericardial effusion. Atherosclerotic calcification along the aorta and coronaries. Mediastinum/Nodes: Patulous esophagus without evidence of inflammation/wall thickening. No adenopathy Lungs/Pleura: Low volume chest with dependent atelectasis. There is no edema, consolidation, effusion, or pneumothorax. Musculoskeletal: Spinal degeneration without acute finding. CT ABDOMEN PELVIS FINDINGS Hepatobiliary: Hepatic steatosis with central sparing.Cholelithiasis without gallbladder distention or bile duct dilatation. No visible calcified choledocholithiasis. Pancreas: Diffusely enlarged with peripancreatic and generalized retroperitoneal stranding. No gross collection. Spleen: Unremarkable. Adrenals/Urinary Tract: Negative adrenals. No hydronephrosis or stone. A cystic density is present at the upper pole right kidney. The bladder is decompressed by a redundant Foley catheter.  Stomach/Bowel: Formed stool throughout much of the colon with rectal distention. No small bowel dilatation. Vascular/Lymphatic: Multifocal atherosclerotic calcification. No mass  or adenopathy. Reproductive:Hysterectomy Other: No ascites or pneumoperitoneum. Musculoskeletal: Remote endplate fractures of L1, L3, and L4. A lucency in the L4 body is likely Schmorl's node. Remote left obturator ring fracture. Bone harvest site from the left ilium. IMPRESSION: 1. Acute pancreatitis. 2. Cholelithiasis. 3. Hepatic steatosis. 4. Low volume chest with atelectasis. Electronically Signed   By: Marnee Spring M.D.   On: 08-May-2020 05:42   DG Abd 1 View  Result Date: May 08, 2020 CLINICAL DATA:  Orogastric tube placement. EXAM: ABDOMEN - 1 VIEW COMPARISON:  None. FINDINGS: The bowel gas pattern is normal. Distal tip of enteric tube is seen in distal esophagus. No radio-opaque calculi or other significant radiographic abnormality are seen. IMPRESSION: Distal tip of enteric tube seen in distal esophagus; advancement is recommended. No evidence of bowel obstruction or ileus. Electronically Signed   By: Lupita Raider M.D.   On: 08-May-2020 12:26   CT Chest Wo Contrast  Result Date: 08-May-2020 CLINICAL DATA:  Respiratory illness with nondiagnostic x-ray. Vomiting. EXAM: CT CHEST, ABDOMEN AND PELVIS WITHOUT CONTRAST TECHNIQUE: Multidetector CT imaging of the chest, abdomen and pelvis was performed following the standard protocol without IV contrast. COMPARISON:  None. FINDINGS: CT CHEST FINDINGS Cardiovascular: Normal heart size. No pericardial effusion. Atherosclerotic calcification along the aorta and coronaries. Mediastinum/Nodes: Patulous esophagus without evidence of inflammation/wall thickening. No adenopathy Lungs/Pleura: Low volume chest with dependent atelectasis. There is no edema, consolidation, effusion, or pneumothorax. Musculoskeletal: Spinal degeneration without acute finding. CT ABDOMEN PELVIS FINDINGS  Hepatobiliary: Hepatic steatosis with central sparing.Cholelithiasis without gallbladder distention or bile duct dilatation. No visible calcified choledocholithiasis. Pancreas: Diffusely enlarged with peripancreatic and generalized retroperitoneal stranding. No gross collection. Spleen: Unremarkable. Adrenals/Urinary Tract: Negative adrenals. No hydronephrosis or stone. A cystic density is present at the upper pole right kidney. The bladder is decompressed by a redundant Foley catheter. Stomach/Bowel: Formed stool throughout much of the colon with rectal distention. No small bowel dilatation. Vascular/Lymphatic: Multifocal atherosclerotic calcification. No mass or adenopathy. Reproductive:Hysterectomy Other: No ascites or pneumoperitoneum. Musculoskeletal: Remote endplate fractures of L1, L3, and L4. A lucency in the L4 body is likely Schmorl's node. Remote left obturator ring fracture. Bone harvest site from the left ilium. IMPRESSION: 1. Acute pancreatitis. 2. Cholelithiasis. 3. Hepatic steatosis. 4. Low volume chest with atelectasis. Electronically Signed   By: Marnee Spring M.D.   On: 2020/05/08 05:42   DG Chest Port 1 View  Result Date: 2020-05-08 CLINICAL DATA:  Orogastric tube placement. EXAM: PORTABLE CHEST 1 VIEW COMPARISON:  Same day. FINDINGS: Stable cardiomediastinal silhouette. Endotracheal tube tip is seen at the carina; withdrawal by 2-3 cm is recommended. Distal tip of nasogastric tube is seen in distal esophagus; advancement is recommended. Right internal jugular dialysis catheter is noted with tip in right atrium. No pneumothorax is noted. No significant pleural effusion is noted. Left lung is clear. Mild right basilar subsegmental atelectasis is noted. Bony thorax is unremarkable. IMPRESSION: Endotracheal tube tip is seen at the carina; withdrawal by 2-3 cm is recommended. Distal tip of nasogastric tube is seen in distal esophagus; advancement is recommended. Mild right basilar subsegmental  atelectasis is noted. Electronically Signed   By: Lupita Raider M.D.   On: 05-08-2020 11:54   DG Chest Port 1 View  Result Date: 2020-05-08 CLINICAL DATA:  69 year old female status post central line placement. EXAM: PORTABLE CHEST 1 VIEW COMPARISON:  Chest x-ray 2020-05-08. FINDINGS: There is a right-sided internal jugular central venous catheter with tip terminating in the right atrium. Low lung  volumes. Opacities at the right base, likely to reflect areas of subsegmental atelectasis. No definite consolidative airspace disease. No pleural effusions. No pneumothorax. No evidence of pulmonary edema. Heart size is borderline enlarged, likely accentuated by low lung volumes and portable AP technique. Upper mediastinal contours are within normal limits. Orthopedic fixation hardware in the lower cervical spine incompletely imaged. IMPRESSION: 1. Support apparatus, as above. 2. Low lung volumes with probable subsegmental atelectasis in the right lower lobe. Electronically Signed   By: Trudie Reed M.D.   On: 05-02-2020 10:11   DG Chest Port 1 View  Result Date: May 02, 2020 CLINICAL DATA:  Vomiting, diaphoresis, short of breath, sepsis EXAM: PORTABLE CHEST 1 VIEW COMPARISON:  09/27/2018 FINDINGS: Single frontal view of the chest demonstrates an unremarkable cardiac silhouette. There is mild central vascular congestion. Patchy consolidation is seen in the medial right lung base. No effusion or pneumothorax. No acute bony abnormalities. IMPRESSION: 1. Patchy right basilar consolidation which could reflect airspace disease or atelectasis. 2. Mild central vascular congestion. Electronically Signed   By: Sharlet Salina M.D.   On: 02-May-2020 03:41   ECHOCARDIOGRAM COMPLETE  Result Date: 05/02/20    ECHOCARDIOGRAM REPORT   Patient Name:   Lydia Avila Date of Exam: 05-02-2020 Medical Rec #:  [id number]   Height:       64.0 in Accession #:    [id number]  Weight:       195.8 lb Date of Birth:  Aug 22, 1951   BSA:           1.939 m Patient Age:    69 years    BP:           154/18 mmHg Patient Gender: F           HR:           120 bpm. Exam Location:  [facility] Procedure: 2D Echo, Cardiac Doppler and Color Doppler Indications:     Acute respiratory insufficiency [id number]  History:         Patient has no prior history of Echocardiogram examinations.                  TIA; Risk Factors:Hypertension.  Sonographer:     Cristela Blue RDCS (AE) Referring Phys:  [id number] Eugenie Norrie Diagnosing Phys: Adrian Blackwater MD  Sonographer Comments: Echo performed with patient supine and on artificial respirator. IMPRESSIONS  1. Left ventricular ejection fraction, by estimation, is 50 to 55%. The left ventricle has low normal function. The left ventricle has no regional wall motion abnormalities. There is severe asymmetric left ventricular hypertrophy of the basal-septal segment. Left ventricular diastolic parameters are consistent with Grade II diastolic dysfunction (pseudonormalization).  2. Right ventricular systolic function is normal. The right ventricular size is normal. There is normal pulmonary artery systolic pressure.  3. Left atrial size was mildly dilated.  4. Right atrial size was mildly dilated.  5. The mitral valve is normal in structure. Trivial mitral valve regurgitation. No evidence of mitral stenosis.  6. The aortic valve is normal in structure. Aortic valve regurgitation is not visualized. No aortic stenosis is present.  7. The inferior vena cava is normal in size with greater than 50% respiratory variability, suggesting right atrial pressure of 3 mmHg. Conclusion(s)/Recommendation(s): Findings consistent with hypertrophic obstructive cardiomyopathy. FINDINGS  Left Ventricle: Left ventricular ejection fraction, by estimation, is 50 to 55%. The left ventricle has low normal function. The left ventricle has no regional wall motion abnormalities. The left ventricular internal cavity  size was normal in size. There is severe asymmetric left  ventricular hypertrophy of the basal-septal segment. Left ventricular diastolic parameters are consistent with Grade II diastolic dysfunction (pseudonormalization). Right Ventricle: The right ventricular size is normal. No increase in right ventricular wall thickness. Right ventricular systolic function is normal. There is normal pulmonary artery systolic pressure. The tricuspid regurgitant velocity is 2.27 m/s, and  with an assumed right atrial pressure of 10 mmHg, the estimated right ventricular systolic pressure is 30.6 mmHg. Left Atrium: Left atrial size was mildly dilated. Right Atrium: Right atrial size was mildly dilated. Pericardium: There is no evidence of pericardial effusion. Mitral Valve: The mitral valve is normal in structure. Normal mobility of the mitral valve leaflets. Trivial mitral valve regurgitation. No evidence of mitral valve stenosis. Tricuspid Valve: The tricuspid valve is normal in structure. Tricuspid valve regurgitation is trivial. No evidence of tricuspid stenosis. Aortic Valve: The aortic valve is normal in structure. Aortic valve regurgitation is not visualized. No aortic stenosis is present. Aortic valve mean gradient measures 5.0 mmHg. Aortic valve peak gradient measures 8.6 mmHg. Aortic valve area, by VTI measures 2.24 cm. Pulmonic Valve: The pulmonic valve was normal in structure. Pulmonic valve regurgitation is trivial. No evidence of pulmonic stenosis. Aorta: The aortic root is normal in size and structure and the aortic root, ascending aorta and aortic arch are all structurally normal, with no evidence of dilitation or obstruction. Venous: The inferior vena cava is normal in size with greater than 50% respiratory variability, suggesting right atrial pressure of 3 mmHg. IAS/Shunts: No atrial level shunt detected by color flow Doppler.  LEFT VENTRICLE PLAX 2D LVIDd:         2.98 cm  Diastology LVIDs:         2.19 cm  LV e' lateral:   5.44 cm/s LV PW:         1.66 cm  LV E/e'  lateral: 15.0 LV IVS:        1.89 cm  LV e' medial:    3.48 cm/s LVOT diam:     2.00 cm  LV E/e' medial:  23.4 LV SV:         36 LV SV Index:   19 LVOT Area:     3.14 cm  RIGHT VENTRICLE RV Basal diam:  2.20 cm RV S prime:     7.51 cm/s TAPSE (M-mode): 2.0 cm LEFT ATRIUM             Index       RIGHT ATRIUM          Index LA diam:        3.00 cm 1.55 cm/m  RA Area:     6.72 cm LA Vol (A2C):   22.1 ml 11.40 ml/m RA Volume:   10.10 ml 5.21 ml/m LA Vol (A4C):   19.6 ml 10.11 ml/m LA Biplane Vol: 20.9 ml 10.78 ml/m  AORTIC VALVE                    PULMONIC VALVE AV Area (Vmax):    2.62 cm     PV Vmax:        0.96 m/s AV Area (Vmean):   2.13 cm     PV Peak grad:   3.7 mmHg AV Area (VTI):     2.24 cm     RVOT Peak grad: 5 mmHg AV Vmax:           146.50 cm/s AV Vmean:  100.000 cm/s AV VTI:            0.163 m AV Peak Grad:      8.6 mmHg AV Mean Grad:      5.0 mmHg LVOT Vmax:         122.00 cm/s LVOT Vmean:        67.700 cm/s LVOT VTI:          0.116 m LVOT/AV VTI ratio: 0.71  AORTA Ao Root diam: 3.40 cm MITRAL VALVE               TRICUSPID VALVE MV Area (PHT): 5.34 cm    TR Peak grad:   20.6 mmHg MV Decel Time: 142 msec    TR Vmax:        227.00 cm/s MV E velocity: 81.50 cm/s MV A velocity: 51.90 cm/s  SHUNTS MV E/A ratio:  1.57        Systemic VTI:  0.12 m                            Systemic Diam: 2.00 cm Adrian BlackwaterShaukat Khan MD Electronically signed by Adrian BlackwaterShaukat Khan MD Signature Date/Time: 05/11/2020/2:41:00 PM    Final     Microbiology Recent Results (from the past 240 hour(s))  Blood Culture (routine x 2)     Status: None (Preliminary result)   Collection Time: 09/12/20  3:07 AM   Specimen: BLOOD LEFT HAND  Result Value Ref Range Status   Specimen Description BLOOD LEFT HAND  Final   Special Requests   Final    BOTTLES DRAWN AEROBIC AND ANAEROBIC Blood Culture results may not be optimal due to an inadequate volume of blood received in culture bottles   Culture   Final    NO GROWTH < 12  HOURS Performed at [facility], [address] [place], [place] [address]    Report Status PENDING  Incomplete  SARS Coronavirus 2 by RT PCR (hospital order, performed in [facility]) Nasopharyngeal Nasopharyngeal Swab     Status: None   Collection Time: 09/12/20  3:11 AM   Specimen: Nasopharyngeal Swab  Result Value Ref Range Status   SARS Coronavirus 2 NEGATIVE NEGATIVE Final    Comment: (NOTE) SARS-CoV-2 target nucleic acids are NOT DETECTED.  The SARS-CoV-2 RNA is generally detectable in upper and lower respiratory specimens during the acute phase of infection. The lowest concentration of SARS-CoV-2 viral copies this assay can detect is 250 copies / mL. A negative result does not preclude SARS-CoV-2 infection and should not be used as the sole basis for treatment or other patient management decisions.  A negative result may occur with improper specimen collection / handling, submission of specimen other than nasopharyngeal swab, presence of viral mutation(s) within the areas targeted by this assay, and inadequate number of viral copies (<250 copies / mL). A negative result must be combined with clinical observations, patient history, and epidemiological information.  Fact Sheet for Patients:   BoilerBrush.com.cyhttps://www.fda.gov/media/136312/download  Fact Sheet for Healthcare Providers: https://pope.com/https://www.fda.gov/media/136313/download  This test is not yet approved or  cleared by the [place] FDA and has been authorized for detection and/or diagnosis of SARS-CoV-2 by FDA under an Emergency Use Authorization (EUA).  This EUA will remain in effect (meaning this test can be used) for the duration of the COVID-19 declaration under Section 564(b)(1) of the Act, 21 U.S.C. section 360bbb-3(b)(1), unless the authorization is terminated or revoked sooner.  Performed  at [facility], [address] [place], [place] [address]   Blood Culture (routine x 2)      Status: None (Preliminary result)   Collection Time: 2020/04/19  3:12 AM   Specimen: BLOOD RIGHT WRIST  Result Value Ref Range Status   Specimen Description BLOOD RIGHT WRIST  Final   Special Requests   Final    BOTTLES DRAWN AEROBIC AND ANAEROBIC Blood Culture adequate volume   Culture   Final    NO GROWTH < 12 HOURS Performed at [facility], [address] [place], [place] [address]    Report Status PENDING  Incomplete    Lab Basic Metabolic Panel: Recent Labs  Lab 19-Apr-2020 0307 April 19, 2020 0307 04/19/2020 0545 04-19-20 0729 04-19-2020 1200 04/19/2020 1851 04/19/2020 2000  NA 133*  --   --  134* 137 150* 149*  K 4.4  --   --  4.9 5.1 4.6 3.5  CL 97*  --   --  97* 99 103 107  CO2 8*  --   --  10* 14* 15* 16*  GLUCOSE 528*  --   --  414* 315* 137* 61*  BUN 38*  --   --  40* 46* 39* 31*  CREATININE 3.40*   < > 3.28* 3.44* 3.65* 3.70* 3.10*  CALCIUM 7.8*  --   --  7.1* 6.0* 5.2* 6.0*  PHOS  --   --   --   --   --  8.9*  --    < > = values in this interval not displayed.   Liver Function Tests: Recent Labs  Lab 04/19/2020 0307 April 19, 2020 1851  AST 887*  --   ALT 497*  --   ALKPHOS 98  --   BILITOT 1.3*  --   PROT 7.3  --   ALBUMIN 3.5 <1.0*   Recent Labs  Lab 2020-04-19 0307  LIPASE 3,293*   No results for input(s): AMMONIA in the last 168 hours. CBC: Recent Labs  Lab 19-Apr-2020 0307 April 19, 2020 0545  WBC 11.5* 10.3  NEUTROABS 8.9*  --   HGB 14.4 13.3  HCT 45.9 40.4  MCV 88.8 86.0  PLT 230 156   Cardiac Enzymes: No results for input(s): CKTOTAL, CKMB, CKMBINDEX, TROPONINI in the last 168 hours. Sepsis Labs: Recent Labs  Lab 04/19/20 0307 April 19, 2020 0545 19-Apr-2020 0607 Apr 19, 2020 1200 19-Apr-2020 1851  PROCALCITON 4.84  --   --   --   --   WBC 11.5* 10.3  --   --   --   LATICACIDVEN >11.0*  --  >11.0* >11.0* >11.0*    Procedures/Operations  7/1: Endotracheal intubation 7/1: Right IJ Trialysis catheter placed 7/1: Left femoral CVC placed 7/1: Left femoral  Arterial line placed        Harlon Ditty, [facility] [place] [facility] Pager: [phone]  Judithe Modest 04-19-2020, 11:04 PM

## 2020-05-12 NOTE — H&P (Signed)
Name: Lydia Avila MRN: [id number] DOB: 12-Aug-1951    ADMISSION DATE:  2020/04/28 CONSULTATION DATE: 04-28-20  REFERRING MD : Dr. York Cerise   CHIEF COMPLAINT: Nausea/Vomiting   BRIEF PATIENT DESCRIPTION:  69 yo female admitted with multiorgan failure, sepsis, acute pancreatitis, and DKA requiring insulin gtt   SIGNIFICANT EVENTS/STUDIES:  07/1: Pt admitted to ICU on insulin gtt  07/1: CT Abd/Pelvis/Chest revealed acute pancreatitis. Cholelithiasis. Hepatic steatosis. Low volume chest with atelectasis   HISTORY OF PRESENT ILLNESS:   This is a 69 yo female with a PMH of Dementia, HTN, Anxiety, Depression, Osteoporosis, Acute CVA, and TIA.  She presented to [facility] ER on 07/1 via EMS with c/o nausea/vomiting and shortness of breath onset of symptoms 06/30.  Per ER notes EMS reported upon their arrival at pts home her CBG reading was 526 and axillary temp 96.1 F.  In the ER vital signs were: temp 98.5 F, hr 112, rr 31, and sbp 77-80's. Sepsis protocol initiated and pt received 2.5L LR bolus with sbp increasing to the 90's.  Lab results revealed Na+ 133, chloride 97, CO2 8, glucose 528, BUN 38, creatinine 3.40, calcium 7.8, anion gap 28, lipase 3,293, AST 887, ALT 497, total bilirubin 1.3, lactic acid >11.0, pct 4.84, wbc 11.5, COVID-19 negative, UA negative, and abg pH 6.92/pCO2 25/pO2 112/acid-base deficit 26.6/bicarb 5.1.  CT Abd/Pelvis/Chest revealed acute pancreatitis, cholelithiasis, hepatic steatosis, and low volume chest atelectasis.  Pt received flagyl, vancomycin, cefepime, 1 amp of sodium bicarb, sodium bicarb gtt along with insulin gtt ordered.  PCCM team contacted by ER physician for ICU admission.   PAST MEDICAL HISTORY :   has a past medical history of Dementia (HCC), Hypertension, and TIA (transient ischemic attack).  has a past surgical history that includes Abdominal hysterectomy. Prior to Admission medications   Medication Sig Start Date End Date Taking? Authorizing Provider    alendronate (FOSAMAX) 70 MG tablet Take 70 mg by mouth once a week. Take with a full glass of water on an empty stomach.   Yes [provider]  aspirin 325 MG tablet Take 325 mg by mouth daily.   Yes [provider]  atenolol (TENORMIN) 50 MG tablet TAKE 1 TABLET BY MOUTH EVERY DAY 02/23/16  Yes [provider]  calcium carbonate (OS-CAL - DOSED IN MG OF ELEMENTAL CALCIUM) 1250 (500 Ca) MG tablet Take 1 tablet by mouth daily.   Yes [provider]  Choline Fenofibrate (FENOFIBRIC ACID) 135 MG CPDR Take by mouth. TAKE 1 CAPSULE (135 MG TOTAL) BY MOUTH ONCE DAILY   Yes [provider]  diphenhydrAMINE (BENADRYL ALLERGY) 25 MG tablet Take by mouth.   Yes [provider]  hydrochlorothiazide (HYDRODIURIL) 25 MG tablet Take 25 mg by mouth daily.   Yes [provider]  lisinopril (ZESTRIL) 10 MG tablet Take 10 mg by mouth daily.   Yes [provider]  Multiple Vitamins-Minerals (CENTRUM SILVER ULTRA WOMENS) TABS Take 1 tablet by mouth daily.   Yes [provider]  pravastatin (PRAVACHOL) 40 MG tablet Take 1 tablet (40 mg total) by mouth daily. 09/28/18 2020-04-28 Yes Houston Siren, MD  Vitamin D, Ergocalciferol, (DRISDOL) 50000 units CAPS capsule Take by mouth. 06/23/17  Yes [provider]   No Known Allergies  FAMILY HISTORY:  family history is not on file. SOCIAL HISTORY:  reports that she has been smoking cigarettes. She has been smoking about 0.25 packs per day. She has never used smokeless tobacco. She reports that she  does not drink alcohol and does not use drugs.  REVIEW OF SYSTEMS: Positives in BOLD  Constitutional: Negative for fever, chills, weight loss, malaise/fatigue and diaphoresis.  HENT: Negative for hearing loss, ear pain, nosebleeds, congestion, sore throat, neck pain, tinnitus and ear discharge.   Eyes: Negative for blurred vision, double vision, photophobia, pain, discharge and redness.   Respiratory: cough, hemoptysis, sputum production, shortness of breath, wheezing and stridor.   Cardiovascular: Negative for chest pain, palpitations, orthopnea, claudication, leg swelling and PND.  Gastrointestinal: heartburn, nausea, vomiting, abdominal pain, diarrhea, constipation, blood in stool and melena.  Genitourinary: Negative for dysuria, urgency, frequency, hematuria and flank pain.  Musculoskeletal: Negative for myalgias, back pain, joint pain and falls.  Skin: Negative for itching and rash.  Neurological: Negative for dizziness, tingling, tremors, sensory change, speech change, focal weakness, seizures, loss of consciousness, weakness and headaches.  Endo/Heme/Allergies: Negative for environmental allergies and polydipsia. Does not bruise/bleed easily.  SUBJECTIVE:  c/o abdominal pain   VITAL SIGNS: Temp:  [97.6 F (36.4 C)-99.8 F (37.7 C)] 97.7 F (36.5 C) (07/01 0600) Pulse Rate:  [89-112] 91 (07/01 0600) Resp:  [16-37] 36 (07/01 0600) BP: (77-121)/(48-91) 120/76 (07/01 0600) SpO2:  [95 %-100 %] 100 % (07/01 0600) Weight:  [76.2 kg] 76.2 kg (07/01 0304)  PHYSICAL EXAMINATION: General: acutely ill appearing female, mild respiratory distress  Neuro: alert and oriented, follows commands, scleral icterus  HEENT: supple, no JVD  Cardiovascular: nsr, rrr, no R/G  Lungs: diminished throughout, tachypneic with slight use of accessory muscles to breath  Abdomen: +BS x4, taut, slightly distended, tender Musculoskeletal: 2+ bilateral lower extremity pitting edema  Skin: abrasions left upper extremity   Recent Labs  Lab 04/30/20 0307  NA 133*  K 4.4  CL 97*  CO2 8*  BUN 38*  CREATININE 3.40*  GLUCOSE 528*   Recent Labs  Lab 04/30/20 0307  HGB 14.4  HCT 45.9  WBC 11.5*  PLT 230   CT ABDOMEN PELVIS WO CONTRAST  Result Date: 04-30-20 CLINICAL DATA:  Respiratory illness with nondiagnostic x-ray. Vomiting. EXAM: CT CHEST, ABDOMEN AND PELVIS WITHOUT CONTRAST  TECHNIQUE: Multidetector CT imaging of the chest, abdomen and pelvis was performed following the standard protocol without IV contrast. COMPARISON:  None. FINDINGS: CT CHEST FINDINGS Cardiovascular: Normal heart size. No pericardial effusion. Atherosclerotic calcification along the aorta and coronaries. Mediastinum/Nodes: Patulous esophagus without evidence of inflammation/wall thickening. No adenopathy Lungs/Pleura: Low volume chest with dependent atelectasis. There is no edema, consolidation, effusion, or pneumothorax. Musculoskeletal: Spinal degeneration without acute finding. CT ABDOMEN PELVIS FINDINGS Hepatobiliary: Hepatic steatosis with central sparing.Cholelithiasis without gallbladder distention or bile duct dilatation. No visible calcified choledocholithiasis. Pancreas: Diffusely enlarged with peripancreatic and generalized retroperitoneal stranding. No gross collection. Spleen: Unremarkable. Adrenals/Urinary Tract: Negative adrenals. No hydronephrosis or stone. A cystic density is present at the upper pole right kidney. The bladder is decompressed by a redundant Foley catheter. Stomach/Bowel: Formed stool throughout much of the colon with rectal distention. No small bowel dilatation. Vascular/Lymphatic: Multifocal atherosclerotic calcification. No mass or adenopathy. Reproductive:Hysterectomy Other: No ascites or pneumoperitoneum. Musculoskeletal: Remote endplate fractures of L1, L3, and L4. A lucency in the L4 body is likely Schmorl's node. Remote left obturator ring fracture. Bone harvest site from the left ilium. IMPRESSION: 1. Acute pancreatitis. 2. Cholelithiasis. 3. Hepatic steatosis. 4. Low volume chest with atelectasis. Electronically Signed   By: Marnee Spring M.D.   On: 04-30-20 05:42   CT Chest Wo Contrast  Result Date: 2020-04-30 CLINICAL DATA:  Respiratory illness with nondiagnostic x-ray. Vomiting. EXAM: CT CHEST, ABDOMEN AND PELVIS WITHOUT CONTRAST TECHNIQUE: Multidetector CT imaging  of the chest, abdomen and pelvis was performed following the standard protocol without IV contrast. COMPARISON:  None. FINDINGS: CT CHEST FINDINGS Cardiovascular: Normal heart size. No pericardial effusion. Atherosclerotic calcification along the aorta and coronaries. Mediastinum/Nodes: Patulous esophagus without evidence of inflammation/wall thickening. No adenopathy Lungs/Pleura: Low volume chest with dependent atelectasis. There is no edema, consolidation, effusion, or pneumothorax. Musculoskeletal: Spinal degeneration without acute finding. CT ABDOMEN PELVIS FINDINGS Hepatobiliary: Hepatic steatosis with central sparing.Cholelithiasis without gallbladder distention or bile duct dilatation. No visible calcified choledocholithiasis. Pancreas: Diffusely enlarged with peripancreatic and generalized retroperitoneal stranding. No gross collection. Spleen: Unremarkable. Adrenals/Urinary Tract: Negative adrenals. No hydronephrosis or stone. A cystic density is present at the upper pole right kidney. The bladder is decompressed by a redundant Foley catheter. Stomach/Bowel: Formed stool throughout much of the colon with rectal distention. No small bowel dilatation. Vascular/Lymphatic: Multifocal atherosclerotic calcification. No mass or adenopathy. Reproductive:Hysterectomy Other: No ascites or pneumoperitoneum. Musculoskeletal: Remote endplate fractures of L1, L3, and L4. A lucency in the L4 body is likely Schmorl's node. Remote left obturator ring fracture. Bone harvest site from the left ilium. IMPRESSION: 1. Acute pancreatitis. 2. Cholelithiasis. 3. Hepatic steatosis. 4. Low volume chest with atelectasis. Electronically Signed   By: Marnee Spring M.D.   On: 04/25/20 05:42   DG Chest Port 1 View  Result Date: April 25, 2020 CLINICAL DATA:  Vomiting, diaphoresis, short of breath, sepsis EXAM: PORTABLE CHEST 1 VIEW COMPARISON:  09/27/2018 FINDINGS: Single frontal view of the chest demonstrates an unremarkable cardiac  silhouette. There is mild central vascular congestion. Patchy consolidation is seen in the medial right lung base. No effusion or pneumothorax. No acute bony abnormalities. IMPRESSION: 1. Patchy right basilar consolidation which could reflect airspace disease or atelectasis. 2. Mild central vascular congestion. Electronically Signed   By: Sharlet Salina M.D.   On: Apr 25, 2020 03:41    ASSESSMENT / PLAN:  Acute respiratory failure secondary to severe metabolic acidosis in the setting of DKA  Supplemental O2 for dyspnea and/or hypoxia  Prn bronchodilator therapy  Echo pending   Hypotension secondary to sepsis and hypovolemia  Continuous telemetry monitoring  Will check troponin  Aggressive fluid resuscitation to maintain map >65, prn levophed gtt to maintain map >65 Hold outpatient antihypertensives for now   Acute renal failure with severe metabolic and lactic acidosis  Hyponatremia secondary to volume depletion  Trend BMP, ABG, and lactic acid  Replace electrolytes as indicated  Monitor UOP  Avoid nephrotoxic medications  Sodium bicarb gtt @100  ml/hr   Acute pancreatitis  Transaminitis secondary septic shock  Trend hepatic panel and lipase  Gastroenterology consulted appreciate input  Will likely need MRCP  Trend WBC and monitor fever curve  Lipid panel pending  Trend PCT Follow cultures  Will start zosyn  Keep NPO for now   Prn morphine for pain management   Diabetic ketoacidosis  Continue insulin gtt until anion gap and serum CO2 >20 BMP's q4hrs and Beta-hydroxybutyric Acid q8rs while on insulin gtt Hemoglobin A1c pending  Diabetes coordinator consulted appreciate input   Best Practice: VTE px: subq heparin  SUP px: iv pepcid Diet: keep NPO Code status: Full Code   -Updated pt and pts daughter regarding current plan of care and all questions were answered  , AGNP  Pulmonary/Critical Care Pager [phone] (please enter 7 digits) PCCM Consult Pager  [phone] (please enter 7 digits)

## 2020-05-12 NOTE — Progress Notes (Signed)
   05-May-2020 2300  Clinical Encounter Type  Visited With Family  Visit Type Follow-up;Death  Referral From Nurse  Consult/Referral To Chaplain  Spiritual Encounters  Spiritual Needs Prayer;Emotional;Grief support  Stress Factors  Family Stress Factors Loss  Chaplain received page for the decline of patient. Chaplain made pastoral presences know with empathy, prayer. Chaplain stayed until family was ready to leave and walked them out.

## 2020-05-12 NOTE — Consult Note (Signed)
Lydia Antigua, MD [address], [place], [place], [place], [address] [address], [place], [place], [place], [address] Phone: [phone]  Fax: [phone]  Consultation  Referring Provider:     Dr. Mortimer Fries Primary Care Physician:  Barbaraann Boys, MD Reason for Consultation:    Pancreatitis  Date of Admission:  2020-04-30 Date of Consultation:  04/30/2020         HPI:   Lydia Avila is a 69 y.o. female who is currently intubated and unable to provide history, with history obtained from chart, with GI being consulted for pancreatitis.  Patient lives with her daughter and documentation states she has mild dementia, presented with vomiting, shortness of breath and was admitted with DKA, respiratory distress, AKI and acute pancreatitis.  No known history of pancreatitis.  Lipase elevated to above 3000.  CT reports "diffusely enlarged pancreas with peripancreatic and generalized retroperitoneal stranding."  No visible calcified choledocholithiasis was reported.  No bile duct dilation.  Total bilirubin mildly elevated to 1.3.  Normal alk phos.  Transaminases acutely elevated.  Past Medical History:  Diagnosis Date  . Dementia ([place])   . Hypertension   . TIA (transient ischemic attack)     Past Surgical History:  Procedure Laterality Date  . ABDOMINAL HYSTERECTOMY      Prior to Admission medications   Medication Sig Start Date End Date Taking? Authorizing Provider  alendronate (FOSAMAX) 70 MG tablet Take 70 mg by mouth once a week. Take with a full glass of water on an empty stomach.   Yes [provider]  aspirin 325 MG tablet Take 325 mg by mouth daily.   Yes [provider]  atenolol (TENORMIN) 50 MG tablet TAKE 1 TABLET BY MOUTH EVERY DAY 02/23/16  Yes [provider]  calcium carbonate (OS-CAL - DOSED IN MG OF ELEMENTAL CALCIUM) 1250 (500 Ca) MG tablet Take 1 tablet by mouth daily.   Yes [provider]  Choline Fenofibrate (FENOFIBRIC ACID)  135 MG CPDR Take by mouth. TAKE 1 CAPSULE (135 MG TOTAL) BY MOUTH ONCE DAILY   Yes [provider]  diphenhydrAMINE (BENADRYL ALLERGY) 25 MG tablet Take by mouth.   Yes [provider]  hydrochlorothiazide (HYDRODIURIL) 25 MG tablet Take 25 mg by mouth daily.   Yes [provider]  lisinopril (ZESTRIL) 10 MG tablet Take 10 mg by mouth daily.   Yes [provider]  Multiple Vitamins-Minerals (CENTRUM SILVER ULTRA WOMENS) TABS Take 1 tablet by mouth daily.   Yes [provider]  pravastatin (PRAVACHOL) 40 MG tablet Take 1 tablet (40 mg total) by mouth daily. 09/28/18 Apr 30, 2020 Yes Henreitta Leber, MD  Vitamin D, Ergocalciferol, (DRISDOL) 50000 units CAPS capsule Take by mouth. 06/23/17  Yes [provider]    Family History  Problem Relation Age of Onset  . Breast cancer Neg Hx      Social History   Tobacco Use  . Smoking status: Current Every Day Smoker    Packs/day: 0.25    Types: Cigarettes  . Smokeless tobacco: Never Used  Vaping Use  . Vaping Use: Never used  Substance Use Topics  . Alcohol use: No  . Drug use: No    Allergies as of 30-Apr-2020  . (No Known Allergies)    Review of Systems:    All systems reviewed and negative except where noted in HPI.   Physical Exam:  Vital signs in last 24 hours: Vitals:   04-30-2020 0800 Apr 30, 2020 0900 04/30/2020 1000  05/01/20 1100  BP: 97/84 101/90 (!) 66/53 126/68  Pulse: 89 (!) 30 (!) 29   Resp: (!) 34 (!) 30 (!) 28 17  Temp:      TempSrc:      SpO2: 99% (!) 75% (!) 66%   Weight:      Height:         General:   Pleasant, cooperative in NAD Head:  Normocephalic and atraumatic. Eyes:   No icterus.   Conjunctiva pink. PERRLA. Ears:  Normal auditory acuity. Neck:  Supple; no masses or thyroidomegaly Lungs: Respirations even and unlabored. Lungs clear to auscultation bilaterally.   No wheezes, crackles, or rhonchi.  Abdomen:  Soft, nondistended, nontender. Normal bowel sounds. No  appreciable masses or hepatomegaly.  No rebound or guarding.  Neurologic:  Alert and oriented x3;  grossly normal neurologically. Skin:  Intact without significant lesions or rashes. Cervical Nodes:  No significant cervical adenopathy. Psych:  Alert and cooperative. Normal affect.  LAB RESULTS: Recent Labs    05/01/20 0307 05-01-2020 0545  WBC 11.5* 10.3  HGB 14.4 13.3  HCT 45.9 40.4  PLT 230 156   BMET Recent Labs    01-May-2020 0307 05/01/20 0545 05-01-2020 0729  NA 133*  --  134*  K 4.4  --  4.9  CL 97*  --  97*  CO2 8*  --  10*  GLUCOSE 528*  --  414*  BUN 38*  --  40*  CREATININE 3.40* 3.28* 3.44*  CALCIUM 7.8*  --  7.1*   LFT Recent Labs    05-01-20 0307  PROT 7.3  ALBUMIN 3.5  AST 887*  ALT 497*  ALKPHOS 98  BILITOT 1.3*   PT/INR Recent Labs    01-May-2020 0307  LABPROT 15.2  INR 1.3*    STUDIES: CT ABDOMEN PELVIS WO CONTRAST  Result Date: 05-01-20 CLINICAL DATA:  Respiratory illness with nondiagnostic x-ray. Vomiting. EXAM: CT CHEST, ABDOMEN AND PELVIS WITHOUT CONTRAST TECHNIQUE: Multidetector CT imaging of the chest, abdomen and pelvis was performed following the standard protocol without IV contrast. COMPARISON:  None. FINDINGS: CT CHEST FINDINGS Cardiovascular: Normal heart size. No pericardial effusion. Atherosclerotic calcification along the aorta and coronaries. Mediastinum/Nodes: Patulous esophagus without evidence of inflammation/wall thickening. No adenopathy Lungs/Pleura: Low volume chest with dependent atelectasis. There is no edema, consolidation, effusion, or pneumothorax. Musculoskeletal: Spinal degeneration without acute finding. CT ABDOMEN PELVIS FINDINGS Hepatobiliary: Hepatic steatosis with central sparing.Cholelithiasis without gallbladder distention or bile duct dilatation. No visible calcified choledocholithiasis. Pancreas: Diffusely enlarged with peripancreatic and generalized retroperitoneal stranding. No gross collection. Spleen:  Unremarkable. Adrenals/Urinary Tract: Negative adrenals. No hydronephrosis or stone. A cystic density is present at the upper pole right kidney. The bladder is decompressed by a redundant Foley catheter. Stomach/Bowel: Formed stool throughout much of the colon with rectal distention. No small bowel dilatation. Vascular/Lymphatic: Multifocal atherosclerotic calcification. No mass or adenopathy. Reproductive:Hysterectomy Other: No ascites or pneumoperitoneum. Musculoskeletal: Remote endplate fractures of L1, L3, and L4. A lucency in the L4 body is likely Schmorl's node. Remote left obturator ring fracture. Bone harvest site from the left ilium. IMPRESSION: 1. Acute pancreatitis. 2. Cholelithiasis. 3. Hepatic steatosis. 4. Low volume chest with atelectasis. Electronically Signed   By: Monte Fantasia M.D.   On: 01-May-2020 05:42   CT Chest Wo Contrast  Result Date: 05-01-20 CLINICAL DATA:  Respiratory illness with nondiagnostic x-ray. Vomiting. EXAM: CT CHEST, ABDOMEN AND PELVIS WITHOUT CONTRAST TECHNIQUE: Multidetector CT imaging of the chest, abdomen and pelvis was performed following  the standard protocol without IV contrast. COMPARISON:  None. FINDINGS: CT CHEST FINDINGS Cardiovascular: Normal heart size. No pericardial effusion. Atherosclerotic calcification along the aorta and coronaries. Mediastinum/Nodes: Patulous esophagus without evidence of inflammation/wall thickening. No adenopathy Lungs/Pleura: Low volume chest with dependent atelectasis. There is no edema, consolidation, effusion, or pneumothorax. Musculoskeletal: Spinal degeneration without acute finding. CT ABDOMEN PELVIS FINDINGS Hepatobiliary: Hepatic steatosis with central sparing.Cholelithiasis without gallbladder distention or bile duct dilatation. No visible calcified choledocholithiasis. Pancreas: Diffusely enlarged with peripancreatic and generalized retroperitoneal stranding. No gross collection. Spleen: Unremarkable. Adrenals/Urinary  Tract: Negative adrenals. No hydronephrosis or stone. A cystic density is present at the upper pole right kidney. The bladder is decompressed by a redundant Foley catheter. Stomach/Bowel: Formed stool throughout much of the colon with rectal distention. No small bowel dilatation. Vascular/Lymphatic: Multifocal atherosclerotic calcification. No mass or adenopathy. Reproductive:Hysterectomy Other: No ascites or pneumoperitoneum. Musculoskeletal: Remote endplate fractures of L1, L3, and L4. A lucency in the L4 body is likely Schmorl's node. Remote left obturator ring fracture. Bone harvest site from the left ilium. IMPRESSION: 1. Acute pancreatitis. 2. Cholelithiasis. 3. Hepatic steatosis. 4. Low volume chest with atelectasis. Electronically Signed   By: Monte Fantasia M.D.   On: 04-30-20 05:42   DG Chest Port 1 View  Result Date: 04/30/2020 CLINICAL DATA:  Orogastric tube placement. EXAM: PORTABLE CHEST 1 VIEW COMPARISON:  Same day. FINDINGS: Stable cardiomediastinal silhouette. Endotracheal tube tip is seen at the carina; withdrawal by 2-3 cm is recommended. Distal tip of nasogastric tube is seen in distal esophagus; advancement is recommended. Right internal jugular dialysis catheter is noted with tip in right atrium. No pneumothorax is noted. No significant pleural effusion is noted. Left lung is clear. Mild right basilar subsegmental atelectasis is noted. Bony thorax is unremarkable. IMPRESSION: Endotracheal tube tip is seen at the carina; withdrawal by 2-3 cm is recommended. Distal tip of nasogastric tube is seen in distal esophagus; advancement is recommended. Mild right basilar subsegmental atelectasis is noted. Electronically Signed   By: Marijo Conception M.D.   On: 2020-04-30 11:54   DG Chest Port 1 View  Result Date: Apr 30, 2020 CLINICAL DATA:  69 year old female status post central line placement. EXAM: PORTABLE CHEST 1 VIEW COMPARISON:  Chest x-ray 04-30-2020. FINDINGS: There is a right-sided  internal jugular central venous catheter with tip terminating in the right atrium. Low lung volumes. Opacities at the right base, likely to reflect areas of subsegmental atelectasis. No definite consolidative airspace disease. No pleural effusions. No pneumothorax. No evidence of pulmonary edema. Heart size is borderline enlarged, likely accentuated by low lung volumes and portable AP technique. Upper mediastinal contours are within normal limits. Orthopedic fixation hardware in the lower cervical spine incompletely imaged. IMPRESSION: 1. Support apparatus, as above. 2. Low lung volumes with probable subsegmental atelectasis in the right lower lobe. Electronically Signed   By: Vinnie Langton M.D.   On: 04-30-2020 10:11   DG Chest Port 1 View  Result Date: 2020-04-30 CLINICAL DATA:  Vomiting, diaphoresis, short of breath, sepsis EXAM: PORTABLE CHEST 1 VIEW COMPARISON:  09/27/2018 FINDINGS: Single frontal view of the chest demonstrates an unremarkable cardiac silhouette. There is mild central vascular congestion. Patchy consolidation is seen in the medial right lung base. No effusion or pneumothorax. No acute bony abnormalities. IMPRESSION: 1. Patchy right basilar consolidation which could reflect airspace disease or atelectasis. 2. Mild central vascular congestion. Electronically Signed   By: Randa Ngo M.D.   On: 04/30/20 03:41  Impression / Plan:   Lydia Avila is a 69 y.o. y/o female with multiorgan failure, with GI consult for pancreatitis  Etiology of pancreatitis is unclear at this time Triglycerides are normal No evidence of choledocholithiasis No known history of alcohol abuse  Of her listed medications, pravastatin is class I a drug for causing pancreatitis.  However, this is not a new medication for her.  Lisinopril this is a class III drugs for causing pancreatitis, but it does not appear to be a new medication either.  Class I drugs are more associated with causing  pancreatitis in class III.  However, improving consultation of medications to cause pancreatitis is not always straightforward.  Patient is not on pravastatin during this admission.  Continue management for acute pancreatitis with IV fluids.  Lactated Ringer's at 250 to 300 cc an hour for the first 24 to 48 hours.  Titrate for volume status to avoid fluid overload  Pain control as needed, however, patient is currently intubated and sedated  No drainable fluid collection noted on CT scan  Elevated transaminases likely due to ischemic hepatitis  Monitor repeat liver enzymes daily  Will order viral hepatitis labs to rule out acute viral hepatitis.  Check acetaminophen level  No need for autoimmune hepatitis testing at this time since previous liver enzymes were normal and this is not likely to be the etiology at this time.  Can order these labs in the future if needed  Thank you for involving me in the care of this patient.      LOS: 0 days   Virgel Manifold, MD  2020/04/17, 12:15 PM

## 2020-05-12 NOTE — Progress Notes (Signed)
Shift Summary  Patient restarted with New CRRT filter at 1858 Unable to remove any fluid because of low blood pressure Neo added at 1930 Patient Maxed on all pressors at 2130 Provider aware of patients temperature Bair hugger applied after Warm blankets did not improve temperature.2100 4 Amps of Sodium bicarb given on my shift  2230 noticed neuro changes with the patients pupils Provider made aware. 2300  Patient went into asystole and Time of death called

## 2020-05-12 NOTE — Progress Notes (Addendum)
PHARMACY CONSULT NOTE - FOLLOW UP  Pharmacy Consult for Electrolyte Monitoring and Replacement   Recent Labs: Potassium (mmol/L)  Date Value  May 10, 2020 4.6   Calcium (mg/dL)  Date Value  01/00/7121 5.2 (LL)   Albumin (g/dL)  Date Value  97/58/8325 <1.0 (L)   Phosphorus (mg/dL)  Date Value  49/82/6415 8.9 (H)   Sodium (mmol/L)  Date Value  2020-05-10 150 (H)     Assessment: 69 year old female admitted with DKA, acute pancreatitis and AKI. Patient required intubation 7/1 and is currently sedated with fentanyl. Patient with significant vasopressor requirements, on Levophed and vasopressin. Now on stress dose steroids. Insulin drip for DKA as well as sodium bicarb drip for acidosis. Patient to start on CRRT for acute renal failure. Pharmacy consulted to manage electrolytes.  Goal of Therapy:  Electrolytes WNL, K > 4 while on insulin and bicarb drips  Plan:  Potassium continues to increase despite both insulin and bicarb drips likely in setting of worsening renal function. Patient now to start on CRRT with 2K bath. Will monitor closely as expect potassium to start dropping.  Corrected calcium 7.6. Will order an additional Calcium Gluconate 1g IV this evening.   BMP q4h while on insulin drip. Will continue to follow along and replace electrolytes as needed.  Gardner Candle, PharmD, BCPS Clinical Pharmacist 2020/05/10 7:56 PM

## 2020-05-12 NOTE — Progress Notes (Addendum)
PHARMACY CONSULT NOTE - FOLLOW UP  Pharmacy Consult for Electrolyte Monitoring and Replacement   Recent Labs: Potassium (mmol/L)  Date Value  April 28, 2020 3.5   Calcium (mg/dL)  Date Value  45/36/4680 6.0 (LL)   Albumin (g/dL)  Date Value  32/09/2481 <1.0 (L)   Phosphorus (mg/dL)  Date Value  50/12/7046 8.9 (H)   Sodium (mmol/L)  Date Value  2020/04/28 149 (H)     Assessment: 69 year old female admitted with DKA, acute pancreatitis and AKI. Patient required intubation 7/1 and is currently sedated with fentanyl. Patient with significant vasopressor requirements, on Levophed and vasopressin. Now on stress dose steroids. Insulin drip for DKA as well as sodium bicarb drip for acidosis. Patient to start on CRRT for acute renal failure. Pharmacy consulted to manage electrolytes.  Goal of Therapy:  Electrolytes WNL, K > 4 while on insulin and bicarb drips  Plan:  07/01 @ 2230 K currently 3.5, sodium 149, BG 61. Spoke w/ NP to start D5LR considering sodium is rising and patient's CBG's have been low, currently off insulin drip and still running bicarb at 200 ml/hr. NP said will check a few more CBGs prior to starting D5LR. Will replace K w/ KCI 10 mEq IV x 4 since patient is about to start CRRT and continue to monitor.  Thomasene Ripple, PharmD, BCPS Clinical Pharmacist April 28, 2020 10:32 PM

## 2020-05-12 NOTE — ED Triage Notes (Signed)
Pt BIB EMS. Pt had vomiting yesterday, difficulty breathing, SOB, clammy and diaphoretic. EMS states blood sugar test was 526 and temperature was 96.32f axillary

## 2020-05-12 NOTE — Progress Notes (Signed)
Pharmacy Antibiotic Note  Lydia Avila is a 69 y.o. female admitted on 2020-04-21 with DKA and acute pancreatitis. Pharmacy has been consulted for Zosyn dosing.  Plan: Zosyn 2.25 g IV q8h given AKI  Height: 5\' 4"  (162.6 cm) Weight: 88.8 kg (195 lb 12.3 oz) IBW/kg (Calculated) : 54.7  Temp (24hrs), Avg:98.6 F (37 C), Min:97.6 F (36.4 C), Max:99.8 F (37.7 C)  Recent Labs  Lab 2020-04-21 0307 21-Apr-2020 0545 2020/04/21 0607 04-21-2020 0729  WBC 11.5* 10.3  --   --   CREATININE 3.40* 3.28*  --  3.44*  LATICACIDVEN >11.0*  --  >11.0*  --     Estimated Creatinine Clearance: 16.4 mL/min (A) (by C-G formula based on SCr of 3.44 mg/dL (H)).    No Known Allergies  Antimicrobials this admission: Zosyn 7/1 >>   Microbiology results: 7/1 BCx: NG 12 hrs 7/1 UCx: pending   Thank you for allowing pharmacy to be a part of this patient's care.  9/1, PharmD 2020/04/21 11:26 AM

## 2020-05-12 NOTE — Progress Notes (Signed)
Per Dr. Clovis Fredrickson order, the ETT was pulled back 1 cm. The ETT is now 24 at the lip.

## 2020-05-12 DEATH — deceased
